# Patient Record
Sex: Male | Born: 1960
Health system: Southern US, Community
[De-identification: ages and names within clinical notes are randomized; demographics above are authoritative.]

## PROBLEM LIST (undated history)

## (undated) DIAGNOSIS — F1010 Alcohol abuse, uncomplicated: Secondary | ICD-10-CM

## (undated) DIAGNOSIS — M545 Low back pain, unspecified: Secondary | ICD-10-CM

## (undated) DIAGNOSIS — G8929 Other chronic pain: Secondary | ICD-10-CM

## (undated) DIAGNOSIS — M1990 Unspecified osteoarthritis, unspecified site: Secondary | ICD-10-CM

## (undated) DIAGNOSIS — F17200 Nicotine dependence, unspecified, uncomplicated: Secondary | ICD-10-CM

## (undated) HISTORY — DX: Nicotine dependence, unspecified, uncomplicated: F17.200

## (undated) HISTORY — DX: Low back pain, unspecified: M54.50

## (undated) HISTORY — DX: Low back pain: M54.5

## (undated) HISTORY — DX: Other chronic pain: G89.29

## (undated) HISTORY — DX: Alcohol abuse, uncomplicated: F10.10

---

## 2005-08-17 ENCOUNTER — Emergency Department (HOSPITAL_COMMUNITY): Admission: EM | Admit: 2005-08-17 | Discharge: 2005-08-17 | Payer: Self-pay | Admitting: Family Medicine

## 2005-08-20 ENCOUNTER — Emergency Department (HOSPITAL_COMMUNITY): Admission: EM | Admit: 2005-08-20 | Discharge: 2005-08-20 | Payer: Self-pay | Admitting: Family Medicine

## 2005-10-23 ENCOUNTER — Ambulatory Visit: Payer: Self-pay | Admitting: Internal Medicine

## 2005-10-24 ENCOUNTER — Ambulatory Visit: Payer: Self-pay | Admitting: Internal Medicine

## 2005-12-23 ENCOUNTER — Encounter: Admission: RE | Admit: 2005-12-23 | Discharge: 2006-01-21 | Payer: Self-pay | Admitting: Internal Medicine

## 2006-01-26 ENCOUNTER — Ambulatory Visit: Payer: Self-pay | Admitting: Internal Medicine

## 2008-01-04 ENCOUNTER — Encounter (INDEPENDENT_AMBULATORY_CARE_PROVIDER_SITE_OTHER): Payer: Self-pay | Admitting: Internal Medicine

## 2008-01-04 ENCOUNTER — Ambulatory Visit: Payer: Self-pay | Admitting: Hospitalist

## 2008-01-04 ENCOUNTER — Ambulatory Visit (HOSPITAL_COMMUNITY): Admission: RE | Admit: 2008-01-04 | Discharge: 2008-01-04 | Payer: Self-pay | Admitting: Hospitalist

## 2008-01-06 LAB — CONVERTED CEMR LAB
ALT: <8 units/L (ref 0–53)
AST: 12 units/L (ref 0–37)
Basophils Absolute: 0 10*3/uL (ref 0.0–0.1)
Basophils Relative: 0 % (ref 0–1)
Creatinine, Ser: 1.03 mg/dL (ref 0.40–1.50)
Eosinophils Relative: 1 % (ref 0–5)
HCT: 45.3 % (ref 39.0–52.0)
Hemoglobin: 15.3 g/dL (ref 13.0–17.0)
MCHC: 33.8 g/dL (ref 30.0–36.0)
Monocytes Absolute: 0.7 10*3/uL (ref 0.1–1.0)
Neutro Abs: 6 10*3/uL (ref 1.7–7.7)
Platelets: 240 10*3/uL (ref 150–400)
RDW: 13.5 % (ref 11.5–15.5)
Sodium: 144 meq/L (ref 135–145)
Total Bilirubin: 0.8 mg/dL (ref 0.3–1.2)
Total Protein: 7 g/dL (ref 6.0–8.3)

## 2008-01-07 ENCOUNTER — Encounter (INDEPENDENT_AMBULATORY_CARE_PROVIDER_SITE_OTHER): Payer: Self-pay | Admitting: Internal Medicine

## 2008-01-07 ENCOUNTER — Ambulatory Visit: Payer: Self-pay | Admitting: Internal Medicine

## 2008-01-07 ENCOUNTER — Encounter (INDEPENDENT_AMBULATORY_CARE_PROVIDER_SITE_OTHER): Payer: Self-pay | Admitting: Infectious Diseases

## 2008-01-08 ENCOUNTER — Encounter (INDEPENDENT_AMBULATORY_CARE_PROVIDER_SITE_OTHER): Payer: Self-pay | Admitting: Infectious Diseases

## 2008-07-11 ENCOUNTER — Ambulatory Visit: Payer: Self-pay | Admitting: Internal Medicine

## 2008-07-11 DIAGNOSIS — M549 Dorsalgia, unspecified: Secondary | ICD-10-CM | POA: Insufficient documentation

## 2009-10-10 ENCOUNTER — Ambulatory Visit (HOSPITAL_COMMUNITY): Admission: RE | Admit: 2009-10-10 | Discharge: 2009-10-10 | Payer: Self-pay | Admitting: Internal Medicine

## 2009-10-10 ENCOUNTER — Ambulatory Visit: Payer: Self-pay | Admitting: Internal Medicine

## 2009-10-10 DIAGNOSIS — M542 Cervicalgia: Secondary | ICD-10-CM

## 2009-10-10 DIAGNOSIS — D179 Benign lipomatous neoplasm, unspecified: Secondary | ICD-10-CM | POA: Insufficient documentation

## 2009-10-10 DIAGNOSIS — F17200 Nicotine dependence, unspecified, uncomplicated: Secondary | ICD-10-CM

## 2009-10-11 DIAGNOSIS — F1910 Other psychoactive substance abuse, uncomplicated: Secondary | ICD-10-CM | POA: Insufficient documentation

## 2009-10-11 LAB — CONVERTED CEMR LAB
ALT: 14 units/L (ref 0–53)
Amphetamine Screen, Ur: NEGATIVE
BUN: 12 mg/dL (ref 6–23)
Benzodiazepines.: NEGATIVE
CO2: 25 meq/L (ref 19–32)
Calcium: 9.6 mg/dL (ref 8.4–10.5)
Chloride: 105 meq/L (ref 96–112)
Cholesterol: 153 mg/dL (ref 0–200)
Creatinine, Ser: 1.02 mg/dL (ref 0.40–1.50)
Glucose, Bld: 97 mg/dL (ref 70–99)
HDL: 59 mg/dL (ref >39)
Marijuana Metabolite: POSITIVE — AB
Methadone: NEGATIVE
Opiates: NEGATIVE
Total CHOL/HDL Ratio: 2.6
Triglycerides: 54 mg/dL (ref <150)

## 2009-10-31 ENCOUNTER — Ambulatory Visit: Payer: Self-pay | Admitting: Internal Medicine

## 2010-10-08 NOTE — Assessment & Plan Note (Signed)
Summary: 2WK F/U/EST/VS   Vital Signs:  Patient profile:   50 year old male Height:      72 inches (182.88 cm) Weight:      145.04 pounds (65.93 kg) BMI:     19.74 Temp:     97 degrees F (36.11 degrees C) oral Pulse rate:   64 / minute BP sitting:   121 / 79  (right arm)  Vitals Entered By: Angelina Ok RN (October 31, 2009 8:39 AM) Is Patient Diabetic? No Pain Assessment Patient in pain? no      Nutritional Status BMI of 19 -24 = normal  Have you ever been in a relationship where you felt threatened, hurt or afraid?No   Does patient need assistance? Functional Status Self care Ambulation Normal Comments Shoulder and neck pain follow up.  Unable to afford medication.  Will refer to D. Hill and D. Tessitore.   Primary Care Provider:  Joaquin Courts  MD   History of Present Illness: Pt is a 50 yo male w/ past med hx below here for f/u of R neck/shoulder pain.  He has not gotton any of his prescriptions filled b/c he can't afford them, even thought the naproxen ins on $4 plan at [facility].  He does not that his pain is better and he periodically applies heat to the neck and shoulder and feels like this is helping.    Depression History:      The patient is having a depressed mood most of the day but denies diminished interest in his usual daily activities.        The patient denies that he feels like life is not worth living, denies that he wishes that he were dead, and denies that he has thought about ending his life.         Preventive Screening-Counseling & Management  Alcohol-Tobacco     Alcohol type: BEER / 2/ 40 A DAY     Smoking Status: current     Packs/Day: 3 cigs per day     Year Started: AT THE AGE OF 18  Current Medications (verified): 1)  Naprosyn 500 Mg Tabs (Naproxen) .... Take 1 Tablet By Mouth Two Times A Day As Needed For Pain. 2)  Flexeril 10 Mg Tabs (Cyclobenzaprine Hcl) .... Take One Tablet By Mouth Every 8 Hours As Needed For Pain.  Allergies  (verified): No Known Drug Allergies  Past History:  Past Medical History: Last updated: 07/11/2008 Chronic low back pain Etoh abuse Tobacco dependence Cocaine abuse  Social History: Last updated: 10/31/2009 + Smoking, + etoh-4 cans of beer a day. Drug use-denies cocaine use presently, however UDS positive  Risk Factors: Smoking Status: current (10/31/2009) Packs/Day: 3 cigs per day (10/31/2009)  Social History: Reviewed history from 10/10/2009 and no changes required. + Smoking, + etoh-4 cans of beer a day. Drug use-denies cocaine use presently, however UDS positive  Review of Systems       As per HPI.  Physical Exam  General:  alert, oriented, no distress. Eyes:  anicteric Lungs:  Normal respiratory effort, chest expands symmetrically. Lungs are clear to auscultation, no crackles or wheezes. Heart:  Normal rate and regular rhythm. S1 and S2 normal without gallop, murmur, click, rub or other extra sounds. Msk:  strength intact w/ neck flxn, extn, and lateral mvts.  FROM of bilateral shoulders.    Large orange size mass, likely lipoma on posterior R shoulder. Extremities:  no edema Neurologic:  gait normal. Psych:  flat affect.  Impression & Recommendations:  Problem # 1:  NECK PAIN (ICD-723.1) Xray shows some deg changes.  Pain improving.  Can pick up prescriptions if needed and advised ice/heat as needed.  Will f/u as needed.  Avoiding narcotics b/c of PSA hx.  His updated medication list for this problem includes:    Naprosyn 500 Mg Tabs (Naproxen) .Marland Kitchen... Take 1 tablet by mouth two times a day as needed for pain.    Flexeril 10 Mg Tabs (Cyclobenzaprine hcl) .Marland Kitchen... Take one tablet by mouth every 8 hours as needed for pain.  Problem # 2:  LIPOMA (ICD-214.9) Surgery referral placed today as pt would like it removed.  Orders: Surgical Referral (Surgery)  Problem # 3:  NICOTINE ADDICTION (ICD-305.1) smoking cessation counceling provided, including illegal  substances.  Precontemplative.  Problem # 4:  Preventive Health Care (ICD-V70.0) Tetanus shot today.  Up to date on lipids and HIV screening.   Problem # 5:  SUBSTANCE ABUSE, MULTIPLE (ICD-305.90) Councelled on alcohol and smoking cessation.   Complete Medication List: 1)  Naprosyn 500 Mg Tabs (Naproxen) .... Take 1 tablet by mouth two times a day as needed for pain. 2)  Flexeril 10 Mg Tabs (Cyclobenzaprine hcl) .... Take one tablet by mouth every 8 hours as needed for pain.  Patient Instructions: 1)  Please make a followup appointment as needed. 2)  Please try to quit smoking.  You can try calling 1-800-QUIT-NOW which is a free hotline to help you quit smoking.   Prevention & Chronic Care Immunizations   Influenza vaccine: Not documented   Influenza vaccine deferral: Refused  (10/31/2009)    Tetanus booster: Not documented    Pneumococcal vaccine: Not documented  Other Screening   PSA: Not documented   PSA action/deferral: Discussed-PSA declined  (10/31/2009)   Smoking status: current  (10/31/2009)  Lipids   Total Cholesterol: 153  (10/10/2009)   LDL: 83  (10/10/2009)   LDL Direct: Not documented   HDL: 59  (10/10/2009)   Triglycerides: 54  (10/10/2009)   Nursing Instructions: Give tetanus booster today      Vital Signs:  Patient profile:   50 year old male Height:      72 inches (182.88 cm) Weight:      145.04 pounds (65.93 kg) BMI:     19.74 Temp:     97 degrees F (36.11 degrees C) oral Pulse rate:   64 / minute BP sitting:   121 / 79  (right arm)  Vitals Entered By: Angelina Ok RN (October 31, 2009 8:39 AM)     Appended Document: 2WK F/U/EST/VS   Immunizations Administered:  Tetanus Vaccine:    Vaccine Type: Td    Site: left deltoid    Mfr: Sanofi Pasteur    Dose: 0.5 ml    Route: IM    Given by: Angelina Ok RN    Exp. Date: 07/24/2011    Lot #: [id number]    VIS given: 07/27/07 version given October 31, 2009.

## 2010-10-08 NOTE — Assessment & Plan Note (Signed)
Summary: R shoulder, lg soft nodule(10+cm), pain rad to side back/pcp-...   Vital Signs:  Patient profile:   50 year old male Height:      72 inches (182.88 cm) Weight:      143.02 pounds (65.01 kg) BMI:     19.47 Temp:     98.1 degrees F (36.72 degrees C) oral Pulse rate:   93 / minute BP sitting:   103 / 76  (right arm)  Vitals Entered By: Angelina Ok RN (October 10, 2009 9:22 AM) CC: Depression Is Patient Diabetic? No Pain Assessment Patient in pain? no      Nutritional Status BMI of 19 -24 = normal  Have you ever been in a relationship where you felt threatened, hurt or afraid?No   Does patient need assistance? Functional Status Self care Ambulation Normal Comments Problem with neck, shoulder.  Cannot lay on stomach or side ways.  Pain keeps him awake  Needs refill on muscle relaxant wooked a little.   Primary Care Provider:  Joaquin Courts  MD  CC:  Depression.  History of Present Illness: Pt is a 50 yo male w/ past med hx below here for evaluation of R side neck and shoulder pain that started months ago and has been getting progressively worse.  Pain is worse at night. Pain goes down his arm and axilla and around the R shoulder.  He did not have a known injury.  No fevers or other systemic complaints.  No parasthesias.  He has tried oxycodone and flexeril from a prior prescription that helps some with the pain.  He also has a knot on his R shoulder that is getting bigger and does not hurt, itches occasionally.  He notes feeling depressed b/c he is unemployed.  Has difficulty falling asleep b/c of pain in his R shoulder, appetite is good, no anhedonia.  Depression History:      The patient denies a depressed mood most of the day and a diminished interest in his usual daily activities.        The patient denies that he feels like life is not worth living, denies that he wishes that he were dead, and denies that he has thought about ending his life.        Comments:   Unemployment makes him depressed.   Preventive Screening-Counseling & Management  Alcohol-Tobacco     Alcohol type: BEER / 2/ 40 A DAY     Smoking Status: current     Packs/Day: 3 cigs per day     Year Started: AT THE AGE OF 18  Comments: Has decreased to 3 cigs per day.  Current Medications (verified): 1)  None  Allergies (verified): No Known Drug Allergies  Past History:  Past Medical History: Last updated: 07/11/2008 Chronic low back pain Etoh abuse Tobacco dependence Cocaine abuse  Social History: Last updated: 10/10/2009 + Smoking, + etoh-4 cans of beer a day. Drug use-denies cocaine use presently  Risk Factors: Smoking Status: current (10/10/2009) Packs/Day: 3 cigs per day (10/10/2009)  Family History: No family hx of MI or CVA.  Social History: Reviewed history from 07/11/2008 and no changes required. + Smoking, + etoh-4 cans of beer a day. Drug use-denies cocaine use presently Packs/Day:  3 cigs per day  Review of Systems       As per HPI.  Physical Exam  General:  alert, oriented, appears stated age, no distress. Eyes:  muddied sclera, PERRL. Neck:  Slightly TTP on R, decreased  ROM in all spheres.   Lungs:  normal respiratory effort and normal breath sounds.   Heart:  normal rate, regular rhythm, no murmur, no gallop, and no rub.   Abdomen:  Thin, +BS', soft, NT and ND. Extremities:  no edema Neurologic:  CN's intact, biceps, triceps, BR, and patellar reflexes symmetric bilaterally.  Strength equal in both UE w/ flxn, extn, pronation and suppination.  Skin:  Large orange size mass on R posterior shoulder.  Psych:  flat affect, fair eye contact, no SI.   Impression & Recommendations:  Problem # 1:  NECK PAIN (ICD-723.1)  ? 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  Will check plain film to evaluate for less likely etiology, such as lytic lesion.  Should see deg changes if that is contributing.  No evidence on exam for neural  encroachment so will hold on MRI for now.  Will tx w/ high dose NSAID's and flexeril to help w/ pain and f/u in 2-4 weeks.   Orders: Diagnostic X-Ray/Fluoroscopy (Diagnostic X-Ray/Flu) T-Drug Screen-Urine, (single) ([id number])  Problem # 2:  LIPOMA (ICD-214.9) Will refer to surgery for possible removal.   Problem # 3:  NICOTINE ADDICTION (ICD-305.1) Pre contemplative.   Problem # 4:  Preventive Health Care (ICD-V70.0)  Lipids today, HIV today.   Orders: T-Lipid Profile [id number]) T-Comprehensive Metabolic Panel [id number]) T-HIV Antibody  (Reflex) [phone])  Complete Medication List: 1)  Naprosyn 500 Mg Tabs (Naproxen) .... Take 1 tablet by mouth two times a day as needed for pain. 2)  Flexeril 10 Mg Tabs (Cyclobenzaprine hcl) .... Take one tablet by mouth every 8 hours as needed for pain.  Patient Instructions: 1)  Please make a followup appointment in 2-4 weeks for a checkup. 2)  You will be called with any abnormal labwork.  Please make sure your phone number is correct at the front desk. 3)  Please try to quit smoking.  You can try calling 1-800-QUIT-NOW which is a free hotline to help you quit smoking.  4)  Please see Jaynee Eagles today. Prescriptions: FLEXERIL 10 MG TABS (CYCLOBENZAPRINE HCL) Take one tablet by mouth every 8 hours as needed for pain.  #63 x 0   Entered and Authorized by:   Joaquin Courts  MD   Signed by:   Joaquin Courts  MD on 10/10/2009   Method used:   Print then Give to Patient   RxID:   [id number] NAPROSYN 500 MG TABS (NAPROXEN) Take 1 tablet by mouth two times a day as needed for pain.  #42 x 0   Entered and Authorized by:   Joaquin Courts  MD   Signed by:   Joaquin Courts  MD on 10/10/2009   Method used:   Print then Give to Patient   RxID:   [id number]   Prevention & Chronic Care Immunizations   Influenza vaccine: Not documented   Influenza vaccine deferral: Deferred  (10/10/2009)    Tetanus booster: Not  documented    Pneumococcal vaccine: Not documented  Other Screening   PSA: Not documented   Smoking status: current  (10/10/2009)  Lipids   Total Cholesterol: Not documented   LDL: Not documented   LDL Direct: Not documented   HDL: Not documented   Triglycerides: Not documented  Process Orders Check Orders Results:     [facility]: ABN not required for this insurance Tests Sent for requisitioning (October 10, 2009 9:57 AM):     10/10/2009: [facility] -- T-Lipid Profile [phone] (  signed)     10/10/2009: [facility] -- T-Comprehensive Metabolic Panel [[id number]] (signed)     10/10/2009: [facility] -- T-HIV Antibody  (Reflex) [[id number]] (signed)     10/10/2009: [facility] -- T-Drug Screen-Urine, (single) [[id number]] (signed)

## 2011-01-11 ENCOUNTER — Emergency Department (HOSPITAL_COMMUNITY): Payer: Self-pay

## 2011-01-11 ENCOUNTER — Emergency Department (HOSPITAL_COMMUNITY)
Admission: EM | Admit: 2011-01-11 | Discharge: 2011-01-11 | Disposition: A | Discharge status: Home / Self Care | Payer: Self-pay | Attending: Emergency Medicine | Admitting: Emergency Medicine

## 2011-01-11 DIAGNOSIS — M549 Dorsalgia, unspecified: Secondary | ICD-10-CM | POA: Insufficient documentation

## 2011-01-11 DIAGNOSIS — G8929 Other chronic pain: Secondary | ICD-10-CM | POA: Insufficient documentation

## 2011-01-11 DIAGNOSIS — M79609 Pain in unspecified limb: Secondary | ICD-10-CM | POA: Insufficient documentation

## 2011-01-11 DIAGNOSIS — M25569 Pain in unspecified knee: Secondary | ICD-10-CM | POA: Insufficient documentation

## 2011-01-11 LAB — POCT I-STAT, CHEM 8
Calcium, Ion: 1.13 mmol/L (ref 1.12–1.32)
Chloride: 102 mEq/L (ref 96–112)
Creatinine, Ser: 1.1 mg/dL (ref 0.4–1.5)
Glucose, Bld: 95 mg/dL (ref 70–99)
HCT: 51 % (ref 39.0–52.0)
Potassium: 4.4 mEq/L (ref 3.5–5.1)

## 2011-02-03 ENCOUNTER — Encounter: Payer: Self-pay | Admitting: Internal Medicine

## 2011-06-02 ENCOUNTER — Emergency Department (HOSPITAL_COMMUNITY)
Admission: EM | Admit: 2011-06-02 | Discharge: 2011-06-02 | Disposition: A | Discharge status: Home / Self Care | Payer: Self-pay | Attending: Emergency Medicine | Admitting: Emergency Medicine

## 2011-06-02 ENCOUNTER — Encounter (HOSPITAL_COMMUNITY): Payer: Self-pay | Admitting: Radiology

## 2011-06-02 ENCOUNTER — Emergency Department (HOSPITAL_COMMUNITY): Payer: Self-pay

## 2011-06-02 DIAGNOSIS — G8929 Other chronic pain: Secondary | ICD-10-CM | POA: Insufficient documentation

## 2011-06-02 DIAGNOSIS — M549 Dorsalgia, unspecified: Secondary | ICD-10-CM | POA: Insufficient documentation

## 2011-06-02 DIAGNOSIS — M129 Arthropathy, unspecified: Secondary | ICD-10-CM | POA: Insufficient documentation

## 2011-06-02 DIAGNOSIS — R51 Headache: Secondary | ICD-10-CM | POA: Insufficient documentation

## 2011-06-02 DIAGNOSIS — H81399 Other peripheral vertigo, unspecified ear: Secondary | ICD-10-CM | POA: Insufficient documentation

## 2011-06-24 ENCOUNTER — Encounter: Payer: Self-pay | Admitting: Internal Medicine

## 2011-06-24 ENCOUNTER — Ambulatory Visit (INDEPENDENT_AMBULATORY_CARE_PROVIDER_SITE_OTHER): Payer: Self-pay | Admitting: Internal Medicine

## 2011-06-24 DIAGNOSIS — R42 Dizziness and giddiness: Secondary | ICD-10-CM

## 2011-06-24 DIAGNOSIS — M549 Dorsalgia, unspecified: Secondary | ICD-10-CM

## 2011-06-24 MED ORDER — CYCLOBENZAPRINE HCL 10 MG PO TABS: 10.0000 mg | ORAL_TABLET | Freq: Three times a day (TID) | ORAL | Status: DC | PRN

## 2011-06-24 MED ORDER — NAPROXEN 500 MG PO TABS: 500.0000 mg | ORAL_TABLET | Freq: Two times a day (BID) | ORAL | Status: DC | PRN

## 2011-06-24 MED ORDER — MECLIZINE HCL 25 MG PO TABS: 25.0000 mg | ORAL_TABLET | Freq: Three times a day (TID) | ORAL | Status: DC | PRN

## 2011-06-24 NOTE — Assessment & Plan Note (Signed)
a 

## 2011-06-24 NOTE — Patient Instructions (Signed)
Please do not use recreational drugs. Please cut down on your drinking. Please do not drive whenever you feel dizzy.  Please come back if you get worse.

## 2011-06-25 NOTE — Progress Notes (Addendum)
HPI: Patient is 50 yo man with PMH of chronic back pain, neck pain, multiple substance abuse, who presents for a follow up visit for his recent ED visit due to dizziness and fall. Per patient, he had intermittent vertiginous dizziness and fall for which he visited ED in our hospital on 06-02-2011.  CT-head did not show any acute intracranial finding. He was diagnosed with vertigo and sent home with Meclizine. Today,  patient reports that he only has very minimal dizziness. He occasionally sees flashes in her eyes, but no feeling of spinning or ringing or loss of hearing. He never feels likely he is going to fall again since last ED visit.  She denies fever, chills, ear pain, headache, palpitation, abdominal pain. He reports that he uses cocaine. Last use of cocaine is 3 months ago.   ROS: General: no fevers, chills, changes in weight, changes in appetite Skin: no rash HEENT: no blurry vision, occasionally seeing flash lights in eyes. No hearing changes, sore throat Pulm: no dyspnea, coughing, wheezing CV: no chest pain, palpitations, shortness of breath Abd: no abdominal pain, nausea/vomiting, diarrhea/constipation GU: no dysuria, hematuria, polyuria Ext: no arthralgias, myalgias Neuro: no weakness, numbness, or tingling  Physical Exam:   General: not in acute stress. HEENT: PERRL, EOMI, no scleral icterus Cardiac: RRR, no rubs, murmurs or gallops Pulm: clear to auscultation bilaterally, moving normal volumes of air Abd: soft, nontender, nondistended, BS present Ext: warm and well perfused, no pedal edema Neuro: alert and oriented X3, cranial nerves II-XII grossly intact, strength and sensation to light touch equal in bilateral upper and lower extremities. Normal gait. Normal finger to nose test.  Assessment and Plan:  Problem 1: Dizziness: Most likely peripheral cause of vertigo. Her dizziniss is improving with Meclizine. No risk of fall during past more than 3 weeks. Neuron exam is  completely normal.  Another possibility is cocaine abuse. Patient refused to do UDS test. He reports cocaine use in the past. I did counseling about the importance of quitting cocaine. He understood that cocaine could potentially cause his dizziness and put him in high risk. He would like to quit his cocaine use. I let him continue Meclinze. Patient is instructed to not drive car whenever he feels dizzy.  Problem 2: back pain: his symptoms are mild. No focal neurologic signs. I refilled his Flexeril and Naproxen. Will follow up.            Patient was offered Colonoscopy referral. Because of financial reason, he would like to consider it in next visit. Therefore, it was deferred.

## 2011-06-30 NOTE — Progress Notes (Signed)
i agree with assessment and plan of Dr. Clyde Lundborg.

## 2011-12-24 ENCOUNTER — Encounter (HOSPITAL_COMMUNITY): Payer: Self-pay | Admitting: *Deleted

## 2011-12-24 ENCOUNTER — Emergency Department (HOSPITAL_COMMUNITY)
Admission: EM | Admit: 2011-12-24 | Discharge: 2011-12-24 | Disposition: A | Discharge status: Home / Self Care | Payer: Medicaid Other | Attending: Emergency Medicine | Admitting: Emergency Medicine

## 2011-12-24 DIAGNOSIS — M545 Low back pain, unspecified: Secondary | ICD-10-CM | POA: Insufficient documentation

## 2011-12-24 DIAGNOSIS — F17200 Nicotine dependence, unspecified, uncomplicated: Secondary | ICD-10-CM | POA: Insufficient documentation

## 2011-12-24 MED ORDER — IBUPROFEN 600 MG PO TABS: 600.0000 mg | ORAL_TABLET | Freq: Four times a day (QID) | ORAL | Status: AC | PRN

## 2011-12-24 MED ORDER — HYDROCODONE-ACETAMINOPHEN 5-500 MG PO TABS: 1.0000 | ORAL_TABLET | Freq: Four times a day (QID) | ORAL | Status: AC | PRN

## 2011-12-24 NOTE — ED Notes (Signed)
To ed for eval of lower back pain. This pain is intermittent but became worse last night with no reason. Pt states he experienced freq urination last night. Ambulatory without difficulty

## 2011-12-24 NOTE — ED Provider Notes (Signed)
History     CSN: [id number]  Arrival date & time 12/24/11  4132   First MD Initiated Contact with Patient 12/24/11 1049      Chief Complaint  Patient presents with  . Back Pain  pt c/o low back pain in past week. States hx same for several years intermittently. Says has been doing some bending/lifting recently which he feels made back pain worse, although no specific event/injury recalled. No radicular or leg pain. No numbness/weakness. No gi or gu c/o. No fever or chills.   (Consider location/radiation/quality/duration/timing/severity/associated sxs/prior treatment) Patient is a 51 y.o. male presenting with back pain. The history is provided by the patient.  Back Pain  Pertinent negatives include no fever, no numbness, no abdominal pain and no weakness.    Past Medical History  Diagnosis Date  . Chronic low back pain   . Tobacco dependence   . ETOH abuse     cociane abuse, tobacco abuse    History reviewed. No pertinent past surgical history.  History reviewed. No pertinent family history.  History  Substance Use Topics  . Smoking status: Current Everyday Smoker -- 6.0 packs/day    Types: Cigarettes  . Smokeless tobacco: Not on file  . Alcohol Use: 2.4 oz/week    4 Cans of beer per week     4 cans of beer a day.      Review of Systems  Constitutional: Negative for fever and chills.  HENT: Negative for neck pain.   Gastrointestinal: Negative for nausea, vomiting and abdominal pain.  Musculoskeletal: Positive for back pain.  Skin: Negative for rash and wound.  Neurological: Negative for weakness and numbness.    Allergies  Review of patient's allergies indicates no known allergies.  Home Medications   Current Outpatient Rx  Name Route Sig Dispense Refill  . MECLIZINE HCL 25 MG PO TABS Oral Take 1 tablet (25 mg total) by mouth 3 (three) times daily as needed for dizziness or nausea. 30 tablet 1    BP 112/70  Pulse 87  Temp 97.9 F (36.6 C)  Resp 18   SpO2 100%  Physical Exam  Nursing note and vitals reviewed. Constitutional: He is oriented to person, place, and time. He appears well-developed and well-nourished. No distress.  HENT:  Head: Atraumatic.  Eyes: Pupils are equal, round, and reactive to light.  Neck: Neck supple. No tracheal deviation present.  Cardiovascular: Normal rate.   Pulmonary/Chest: Effort normal. No accessory muscle usage. No respiratory distress.  Abdominal: Soft. He exhibits no distension. There is no tenderness.  Genitourinary:       No cva tenderness  Musculoskeletal: Normal range of motion. He exhibits no edema and no tenderness.       ctls spine non tender, aligned no step off. Lumbar muscular tenderness. No skin changes, erythema, sts or masses felt.   Neurological: He is alert and oriented to person, place, and time.       Motor intact bil. Steady gait. Straight leg raise neg. Normal reflexes.   Skin: Skin is warm and dry.  Psychiatric: He has a normal mood and affect.    ED Course  Procedures (including critical care time)      MDM  Pt c/o low back pain, hx same. No fall or specific injury recalled. Will rx pain.         Suzi Roots, MD 12/24/11 1105

## 2011-12-24 NOTE — Discharge Instructions (Signed)
Take motrin as need for pain.   You may  take vicodin as need for pain. No driving when taking vicodin. Also, do not take tylenol or acetaminophen containing medication when taking vicodin. Avoid bending at waist or heavy lifting more than 20 lbs for the next week. Try heat/heating pad to sore area. Follow up with primary care doctor in coming wee for recheck - discuss referral to back specialist and/or further workup if symptoms fail to improve/resolve. Return to ER if worse, leg numbness/weakness, intractable pain, other concern.      Back Pain, Adult Low back pain is very common. About 1 in 5 people have back pain.The cause of low back pain is rarely dangerous. The pain often gets better over time.About half of people with a sudden onset of back pain feel better in just 2 weeks. About 8 in 10 people feel better by 6 weeks.  CAUSES Some common causes of back pain include:  Strain of the muscles or ligaments supporting the spine.   Wear and tear (degeneration) of the spinal discs.   Arthritis.   Direct injury to the back.  DIAGNOSIS Most of the time, the direct cause of low back pain is not known.However, back pain can be treated effectively even when the exact cause of the pain is unknown.Answering your caregiver's questions about your overall health and symptoms is one of the most accurate ways to make sure the cause of your pain is not dangerous. If your caregiver needs more information, he or she may order lab work or imaging tests (X-rays or MRIs).However, even if imaging tests show changes in your back, this usually does not require surgery. HOME CARE INSTRUCTIONS For many people, back pain returns.Since low back pain is rarely dangerous, it is often a condition that people can learn to [facility] their own.   Remain active. It is stressful on the back to sit or stand in one place. Do not sit, drive, or stand in one place for more than 30 minutes at a time. Take short walks on  level surfaces as soon as pain allows.Try to increase the length of time you walk each day.   Do not stay in bed.Resting more than 1 or 2 days can delay your recovery.   Do not avoid exercise or work.Your body is made to move.It is not dangerous to be active, even though your back may hurt.Your back will likely heal faster if you return to being active before your pain is gone.   Pay attention to your body when you bend and lift. Many people have less discomfortwhen lifting if they bend their knees, keep the load close to their bodies,and avoid twisting. Often, the most comfortable positions are those that put less stress on your recovering back.   Find a comfortable position to sleep. Use a firm mattress and lie on your side with your knees slightly bent. If you lie on your back, put a pillow under your knees.   Only take over-the-counter or prescription medicines as directed by your caregiver. Over-the-counter medicines to reduce pain and inflammation are often the most helpful.Your caregiver may prescribe muscle relaxant drugs.These medicines help dull your pain so you can more quickly return to your normal activities and healthy exercise.   Put ice on the injured area.   Put ice in a plastic bag.   Place a towel between your skin and the bag.   Leave the ice on for 15 to 20 minutes, 3 to 4 times  a day for the first 2 to 3 days. After that, ice and heat may be alternated to reduce pain and spasms.   Ask your caregiver about trying back exercises and gentle massage. This may be of some benefit.   Avoid feeling anxious or stressed.Stress increases muscle tension and can worsen back pain.It is important to recognize when you are anxious or stressed and learn ways to manage it.Exercise is a great option.  SEEK MEDICAL CARE IF:  You have pain that is not relieved with rest or medicine.   You have pain that does not improve in 1 week.   You have new symptoms.   You are  generally not feeling well.  SEEK IMMEDIATE MEDICAL CARE IF:   You have pain that radiates from your back into your legs.   You develop new bowel or bladder control problems.   You have unusual weakness or numbness in your arms or legs.   You develop nausea or vomiting.   You develop abdominal pain.   You feel faint.  Document Released: 08/25/2005 Document Revised: 08/14/2011 Document Reviewed: 01/13/2011 [facility] Patient Information 2012 [place], [place].Back Pain, Adult Low back pain is very common. About 1 in 5 people have back pain.The cause of low back pain is rarely dangerous. The pain often gets better over time.About half of people with a sudden onset of back pain feel better in just 2 weeks. About 8 in 10 people feel better by 6 weeks.  CAUSES Some common causes of back pain include:  Strain of the muscles or ligaments supporting the spine.   Wear and tear (degeneration) of the spinal discs.   Arthritis.   Direct injury to the back.  DIAGNOSIS Most of the time, the direct cause of low back pain is not known.However, back pain can be treated effectively even when the exact cause of the pain is unknown.Answering your caregiver's questions about your overall health and symptoms is one of the most accurate ways to make sure the cause of your pain is not dangerous. If your caregiver needs more information, he or she may order lab work or imaging tests (X-rays or MRIs).However, even if imaging tests show changes in your back, this usually does not require surgery. HOME CARE INSTRUCTIONS For many people, back pain returns.Since low back pain is rarely dangerous, it is often a condition that people can learn to [facility] their own.   Remain active. It is stressful on the back to sit or stand in one place. Do not sit, drive, or stand in one place for more than 30 minutes at a time. Take short walks on level surfaces as soon as pain allows.Try to increase the length of time you  walk each day.   Do not stay in bed.Resting more than 1 or 2 days can delay your recovery.   Do not avoid exercise or work.Your body is made to move.It is not dangerous to be active, even though your back may hurt.Your back will likely heal faster if you return to being active before your pain is gone.   Pay attention to your body when you bend and lift. Many people have less discomfortwhen lifting if they bend their knees, keep the load close to their bodies,and avoid twisting. Often, the most comfortable positions are those that put less stress on your recovering back.   Find a comfortable position to sleep. Use a firm mattress and lie on your side with your knees slightly bent. If you lie on your  back, put a pillow under your knees.   Only take over-the-counter or prescription medicines as directed by your caregiver. Over-the-counter medicines to reduce pain and inflammation are often the most helpful.Your caregiver may prescribe muscle relaxant drugs.These medicines help dull your pain so you can more quickly return to your normal activities and healthy exercise.   Put ice on the injured area.   Put ice in a plastic bag.   Place a towel between your skin and the bag.   Leave the ice on for 15 to 20 minutes, 3 to 4 times a day for the first 2 to 3 days. After that, ice and heat may be alternated to reduce pain and spasms.   Ask your caregiver about trying back exercises and gentle massage. This may be of some benefit.   Avoid feeling anxious or stressed.Stress increases muscle tension and can worsen back pain.It is important to recognize when you are anxious or stressed and learn ways to manage it.Exercise is a great option.  SEEK MEDICAL CARE IF:  You have pain that is not relieved with rest or medicine.   You have pain that does not improve in 1 week.   You have new symptoms.   You are generally not feeling well.  SEEK IMMEDIATE MEDICAL CARE IF:   You have pain that  radiates from your back into your legs.   You develop new bowel or bladder control problems.   You have unusual weakness or numbness in your arms or legs.   You develop nausea or vomiting.   You develop abdominal pain.   You feel faint.  Document Released: 08/25/2005 Document Revised: 08/14/2011 Document Reviewed: 01/13/2011 [facility] Patient Information 2012 [place], [place].

## 2012-03-04 ENCOUNTER — Encounter (HOSPITAL_COMMUNITY): Payer: Self-pay | Admitting: Emergency Medicine

## 2012-03-04 ENCOUNTER — Emergency Department (HOSPITAL_COMMUNITY)
Admission: EM | Admit: 2012-03-04 | Discharge: 2012-03-04 | Disposition: A | Discharge status: Home / Self Care | Payer: Medicaid Other | Attending: Emergency Medicine | Admitting: Emergency Medicine

## 2012-03-04 ENCOUNTER — Emergency Department (HOSPITAL_COMMUNITY): Payer: Medicaid Other

## 2012-03-04 DIAGNOSIS — M25569 Pain in unspecified knee: Secondary | ICD-10-CM | POA: Insufficient documentation

## 2012-03-04 DIAGNOSIS — Z87891 Personal history of nicotine dependence: Secondary | ICD-10-CM | POA: Insufficient documentation

## 2012-03-04 MED ORDER — TRAMADOL HCL 50 MG PO TABS: 50.0000 mg | ORAL_TABLET | Freq: Four times a day (QID) | ORAL | Status: AC | PRN

## 2012-03-04 MED ORDER — HYDROCODONE-ACETAMINOPHEN 5-325 MG PO TABS
1.0000 | ORAL_TABLET | Freq: Once | ORAL | Status: AC
Start: 2012-03-04 — End: 2012-03-04
  Administered 2012-03-04: 1 via ORAL
  Filled 2012-03-04: qty 1

## 2012-03-04 NOTE — ED Provider Notes (Signed)
History   This chart was scribed for Lucas Lennert, MD by Sofie Rower. The patient was seen in room TR10C/TR10C and the patient's care was started at 4:38 PM     CSN: [id number]  Arrival date & time 03/04/12  1553   None     Chief Complaint  Patient presents with  . Knee Pain    (Consider location/radiation/quality/duration/timing/severity/associated sxs/prior treatment) Patient is a 51 y.o. male presenting with knee pain. The history is provided by the patient. No language interpreter was used.  Knee Pain This is a new problem. The current episode started 3 to 5 hours ago. The problem occurs constantly. The problem has not changed since onset.Pertinent negatives include no chest pain, no abdominal pain, no headaches and no shortness of breath. The symptoms are aggravated by bending. Nothing relieves the symptoms. He has tried nothing for the symptoms. The treatment provided no relief.   PCP is Dr. Clyde Lundborg.   Past Medical History  Diagnosis Date  . Chronic low back pain   . Tobacco dependence   . ETOH abuse     cociane abuse, tobacco abuse      History  Substance Use Topics  . Smoking status: Former Smoker    Types: Cigarettes    Quit date: 03/04/2009  . Smokeless tobacco: Not on file  . Alcohol Use: 2.4 oz/week    4 Cans of beer per week     3- 40oz per day      Review of Systems  Constitutional: Negative for fatigue.  HENT: Negative for congestion, sinus pressure and ear discharge.   Eyes: Negative for discharge.  Respiratory: Negative for cough and shortness of breath.   Cardiovascular: Negative for chest pain.  Gastrointestinal: Negative for abdominal pain and diarrhea.  Genitourinary: Negative for frequency and hematuria.  Musculoskeletal: Negative for back pain.  Skin: Negative for rash.  Neurological: Negative for seizures and headaches.  Hematological: Negative.   Psychiatric/Behavioral: Negative for hallucinations.    Allergies  Review of patient's  allergies indicates no known allergies.  Home Medications   Current Outpatient Rx  Name Route Sig Dispense Refill  . ACETAMINOPHEN 500 MG PO TABS Oral Take 1,500 mg by mouth every 6 (six) hours as needed. For pain      BP 132/76  Pulse 96  Temp 99.3 F (37.4 C) (Oral)  Resp 20  SpO2 97%  Physical Exam  Nursing note and vitals reviewed. Constitutional: He is oriented to person, place, and time. He appears well-developed.  HENT:  Head: Normocephalic.  Eyes: Conjunctivae are normal.  Neck: No tracheal deviation present.  Cardiovascular:  No murmur heard. Musculoskeletal: Normal range of motion. He exhibits tenderness (Tenderness to the lateral left knee, decreased ROM, pain with flexion. ).  Neurological: He is oriented to person, place, and time.  Skin: Skin is warm.  Psychiatric: He has a normal mood and affect.    ED Course  Procedures (including critical care time)  DIAGNOSTIC STUDIES: Oxygen Saturation is 97% on room air, normal by my interpretation.    COORDINATION OF CARE:  4:40PM- EDP at bedside discusses treatment plan concerning pain management and x-ray.    Labs Reviewed - No data to display Dg Knee Complete 4 Views Left  03/04/2012  *RADIOLOGY REPORT*  Clinical Data: Knee pain.  LEFT KNEE - COMPLETE 4+ VIEW  Comparison: None.  Findings: No joint effusion or fracture.  Trace patellofemoral osteophytosis.  Chondrocalcinosis in the medial and lateral compartments.  IMPRESSION: No acute  findings.  Minimal degenerative change.  Original Report Authenticated By: Reyes Ivan, M.D.      No diagnosis found.    MDM       The chart was scribed for me under my direct supervision.  I personally performed the history, physical, and medical decision making and all procedures in the evaluation of this patient.Lucas Lennert, MD 03/04/12 1728

## 2012-03-04 NOTE — Discharge Instructions (Signed)
Follow up with dr. Lestine Box if not improving.

## 2012-03-04 NOTE — ED Notes (Signed)
Pt c/o posterior knee pain; pt reports unable to bend knee- started at 11:30; reports that has had this pain before; unsure of injury; pt with no swelling to knee;  + pulses

## 2012-06-09 ENCOUNTER — Encounter (HOSPITAL_COMMUNITY): Payer: Self-pay | Admitting: *Deleted

## 2012-06-09 ENCOUNTER — Emergency Department (HOSPITAL_COMMUNITY)
Admission: EM | Admit: 2012-06-09 | Discharge: 2012-06-09 | Disposition: A | Discharge status: Home / Self Care | Payer: Medicaid Other | Attending: Emergency Medicine | Admitting: Emergency Medicine

## 2012-06-09 ENCOUNTER — Emergency Department (HOSPITAL_COMMUNITY): Payer: Medicaid Other

## 2012-06-09 DIAGNOSIS — M5030 Other cervical disc degeneration, unspecified cervical region: Secondary | ICD-10-CM | POA: Insufficient documentation

## 2012-06-09 DIAGNOSIS — Z87891 Personal history of nicotine dependence: Secondary | ICD-10-CM | POA: Insufficient documentation

## 2012-06-09 DIAGNOSIS — IMO0002 Reserved for concepts with insufficient information to code with codable children: Secondary | ICD-10-CM

## 2012-06-09 DIAGNOSIS — M542 Cervicalgia: Secondary | ICD-10-CM

## 2012-06-09 HISTORY — DX: Unspecified osteoarthritis, unspecified site: M19.90

## 2012-06-09 MED ORDER — TRAMADOL HCL 50 MG PO TABS: 50.0000 mg | ORAL_TABLET | Freq: Four times a day (QID) | ORAL | Status: DC | PRN

## 2012-06-09 MED ORDER — IBUPROFEN 600 MG PO TABS: 600.0000 mg | ORAL_TABLET | Freq: Four times a day (QID) | ORAL | Status: DC | PRN

## 2012-06-09 NOTE — ED Provider Notes (Signed)
History    This chart was scribed for Nelia Shi, MD, MD by Smitty Pluck. The patient was seen in room TR09C and the patient's care was started at 11:35AM.  CSN: [id number]  Arrival date & time 06/09/12  1108    Chief Complaint  Patient presents with  . Neck Pain     Patient is a 51 y.o. male presenting with neck pain. The history is provided by the patient. No language interpreter was used.  Neck Pain  Pertinent negatives include no weakness.   Lucas Walker is a 51 y.o. male with hx of arthritis who presents to the Emergency Department complaining of constant, moderate neck pain onset 2 days ago. Pt reports that he has ran out of medication that relieves his pain. Denies any other pain currently. Reports that lying down aggravates the pain.   Past Medical History  Diagnosis Date  . Chronic low back pain   . Tobacco dependence   . ETOH abuse     cociane abuse, tobacco abuse  . Arthritis     neck and left shoulder    History reviewed. No pertinent past surgical history.  No family history on file.  History  Substance Use Topics  . Smoking status: Former Smoker    Types: Cigarettes    Quit date: 03/04/2009  . Smokeless tobacco: Not on file  . Alcohol Use: 2.4 oz/week    4 Cans of beer per week     3- 40oz per day      Review of Systems  Constitutional: Negative for fever and chills.  HENT: Positive for neck pain.   Respiratory: Negative for shortness of breath.   Gastrointestinal: Negative for nausea and vomiting.  Neurological: Negative for weakness.  All other systems reviewed and are negative.    Allergies  Review of patient's allergies indicates no known allergies.  Home Medications   Current Outpatient Rx  Name Route Sig Dispense Refill  . ACETAMINOPHEN 500 MG PO TABS Oral Take 1,500 mg by mouth every 6 (six) hours as needed. For pain    . CYCLOBENZAPRINE HCL 10 MG PO TABS Oral Take 10 mg by mouth 3 (three) times daily as needed. For muscle  spasms    . IBUPROFEN 200 MG PO TABS Oral Take 200 mg by mouth every 6 (six) hours as needed. For pain    . TRAMADOL HCL 50 MG PO TABS Oral Take 50 mg by mouth every 6 (six) hours as needed. For pain    . IBUPROFEN 600 MG PO TABS Oral Take 1 tablet (600 mg total) by mouth every 6 (six) hours as needed for pain. 30 tablet 0  . TRAMADOL HCL 50 MG PO TABS Oral Take 1 tablet (50 mg total) by mouth every 6 (six) hours as needed for pain. 15 tablet 0    BP 120/76  Pulse 66  Temp 98 F (36.7 C) (Oral)  Resp 16  SpO2 99%  Physical Exam  Nursing note and vitals reviewed. Constitutional: He is oriented to person, place, and time. He appears well-developed. No distress.  HENT:  Head: Normocephalic and atraumatic.  Eyes: Pupils are equal, round, and reactive to light.  Neck: Muscular tenderness present. Decreased range of motion present.  Cardiovascular: Normal rate and intact distal pulses.   Pulmonary/Chest: No respiratory distress.  Abdominal: Normal appearance. He exhibits no distension.  Neurological: He is alert and oriented to person, place, and time. No cranial nerve deficit.  Skin: Skin is warm  and dry. No rash noted.  Psychiatric: He has a normal mood and affect. His behavior is normal.    ED Course  Procedures (including critical care time) DIAGNOSTIC STUDIES: Oxygen Saturation is 99% on room air, normal by my interpretation.    COORDINATION OF CARE: 11:37 AM Discussed ED treatment with pt     Labs Reviewed - No data to display Dg Cervical Spine 2-3 Views  06/09/2012  *RADIOLOGY REPORT*  Clinical Data: Neck pain.  CERVICAL SPINE - 2-3 VIEW  Comparison: None  Findings: Mild degenerative disc disease changes diffusely throughout the cervical spine, most pronounced at C5-6 and C6-7. No malalignment.  Prevertebral soft tissues are normal.  No fracture.  IMPRESSION: Degenerative disc disease.  No acute findings.   Original Report Authenticated By: Cyndie Chime, M.D.      1.  NECK PAIN   2. Degenerative disc disease       MDM         I personally performed the services described in this documentation, which was scribed in my presence. The recorded information has been reviewed and considered.    Nelia Shi, MD 06/09/12 [id number]

## 2012-06-09 NOTE — ED Notes (Signed)
Pt has returned from being out of the department 

## 2012-06-09 NOTE — ED Notes (Signed)
Pt is here with neck pain and under left posterior shoulder pain from arthritis and has ran out of pain medication

## 2012-06-09 NOTE — ED Notes (Signed)
Patient stated that he is out of his Tramadol for his neck pain.

## 2012-09-07 ENCOUNTER — Emergency Department (HOSPITAL_COMMUNITY)
Admission: EM | Admit: 2012-09-07 | Discharge: 2012-09-07 | Disposition: A | Discharge status: Home / Self Care | Payer: Medicaid Other | Attending: Emergency Medicine | Admitting: Emergency Medicine

## 2012-09-07 ENCOUNTER — Encounter (HOSPITAL_COMMUNITY): Payer: Self-pay | Admitting: *Deleted

## 2012-09-07 ENCOUNTER — Emergency Department (HOSPITAL_COMMUNITY): Payer: Medicaid Other

## 2012-09-07 DIAGNOSIS — G8929 Other chronic pain: Secondary | ICD-10-CM | POA: Insufficient documentation

## 2012-09-07 DIAGNOSIS — M545 Low back pain, unspecified: Secondary | ICD-10-CM | POA: Insufficient documentation

## 2012-09-07 DIAGNOSIS — M25559 Pain in unspecified hip: Secondary | ICD-10-CM | POA: Insufficient documentation

## 2012-09-07 DIAGNOSIS — F1010 Alcohol abuse, uncomplicated: Secondary | ICD-10-CM | POA: Insufficient documentation

## 2012-09-07 DIAGNOSIS — F1410 Cocaine abuse, uncomplicated: Secondary | ICD-10-CM | POA: Insufficient documentation

## 2012-09-07 DIAGNOSIS — M549 Dorsalgia, unspecified: Secondary | ICD-10-CM

## 2012-09-07 DIAGNOSIS — Z87891 Personal history of nicotine dependence: Secondary | ICD-10-CM | POA: Insufficient documentation

## 2012-09-07 MED ORDER — IBUPROFEN 600 MG PO TABS: 600.0000 mg | ORAL_TABLET | Freq: Three times a day (TID) | ORAL | Status: DC | PRN

## 2012-09-07 MED ORDER — HYDROCODONE-ACETAMINOPHEN 5-325 MG PO TABS: 2.0000 | ORAL_TABLET | Freq: Four times a day (QID) | ORAL | Status: DC | PRN

## 2012-09-07 MED ORDER — HYDROCODONE-ACETAMINOPHEN 5-325 MG PO TABS
2.0000 | ORAL_TABLET | Freq: Once | ORAL | Status: AC
  Administered 2012-09-07: 2 via ORAL
  Filled 2012-09-07: qty 2

## 2012-09-07 NOTE — ED Notes (Signed)
Patient transported to X-ray 

## 2012-09-07 NOTE — ED Notes (Signed)
Pt is here with lower back pain and hip pain that is chronic.  No bowel or bladder incontinence.  Pt needs pain control.  Pt has ran out of medications

## 2012-09-07 NOTE — ED Provider Notes (Signed)
History   This chart was scribed for Suzi Roots, MD by Charolett Bumpers, ED Scribe. The patient was seen in room TR07C/TR07C. Patient's care was started at 1241.   CSN: [id number]  Arrival date & time 09/07/12  1224   First MD Initiated Contact with Patient 09/07/12 1241      Chief Complaint  Patient presents with  . Back Pain  . Hip Pain    The history is provided by the patient. No language interpreter was used.  Lucas Walker is a 51 y.o. male who presents to the Emergency Department complaining of 3 days of lower back pain and hip pain. He states that he has chronic issues with his back. He has taken Flexeril, Tramadol, and Motrin. He states that he fell last night, but denies any injuries or falls proceeding the pain. He states that he is unemployed. He denies any numbness or weakness. He denies any redness or skin changes. He states he had x-rays preformed the last time he was seen here. No numbness/weakness. No gi or gu c/o. No fever or chills.    Past Medical History  Diagnosis Date  . Chronic low back pain   . Tobacco dependence   . ETOH abuse     cociane abuse, tobacco abuse  . Arthritis     neck and left shoulder    History reviewed. No pertinent past surgical history.  No family history on file.  History  Substance Use Topics  . Smoking status: Former Smoker    Types: Cigarettes    Quit date: 03/04/2009  . Smokeless tobacco: Not on file  . Alcohol Use: 2.4 oz/week    4 Cans of beer per week     Comment: 3- 40oz per day      Review of Systems  Constitutional: Negative for fever.  HENT: Negative for neck pain.   Gastrointestinal: Negative for abdominal pain.  Musculoskeletal: Positive for back pain and arthralgias.  Neurological: Negative for weakness and numbness.  All other systems reviewed and are negative.    Allergies  Review of patient's allergies indicates no known allergies.  Home Medications   Current Outpatient Rx  Name   Route  Sig  Dispense  Refill  . ACETAMINOPHEN 500 MG PO TABS   Oral   Take 1,500 mg by mouth every 6 (six) hours as needed. For pain         . CYCLOBENZAPRINE HCL 10 MG PO TABS   Oral   Take 10 mg by mouth 3 (three) times daily as needed. For muscle spasms         . IBUPROFEN 200 MG PO TABS   Oral   Take 200 mg by mouth every 6 (six) hours as needed. For pain         . IBUPROFEN 600 MG PO TABS   Oral   Take 1 tablet (600 mg total) by mouth every 6 (six) hours as needed for pain.   30 tablet   0   . TRAMADOL HCL 50 MG PO TABS   Oral   Take 50 mg by mouth every 6 (six) hours as needed. For pain         . TRAMADOL HCL 50 MG PO TABS   Oral   Take 1 tablet (50 mg total) by mouth every 6 (six) hours as needed for pain.   15 tablet   0     BP 133/86  Pulse 83  Temp 97.4 F (  36.3 C) (Oral)  Resp 18  SpO2 100%  Physical Exam  Nursing note and vitals reviewed. Constitutional: He is oriented to person, place, and time. He appears well-developed and well-nourished. No distress.  HENT:  Head: Normocephalic and atraumatic.  Eyes: Conjunctivae normal and EOM are normal. No scleral icterus.  Neck: Neck supple. No tracheal deviation present.  Cardiovascular: Normal rate.   Pulmonary/Chest: Effort normal. No respiratory distress.  Abdominal: Soft. There is no tenderness.  Genitourinary:       No cva  tenderness  Musculoskeletal: Normal range of motion. He exhibits tenderness.       Left lumbar tenderness, otherwise CTLS spine, non tender, aligned, no step off.    Neurological: He is alert and oriented to person, place, and time. No cranial nerve deficit.       Motor intact bil. Steady gait. Straight leg raise neg.   Skin: Skin is warm and dry. No rash noted.  Psychiatric: He has a normal mood and affect. His behavior is normal.    ED Course  Procedures (including critical care time)  DIAGNOSTIC STUDIES: Oxygen Saturation is 100% on room air, normal by my  interpretation.    COORDINATION OF CARE:  13:13-Discussed planned course of treatment with the patient including pain medication and an x-ray of the lumbar spine, who is agreeable at this time.   13:15-Medication Orders: Hydrocodone-acetaminophen (Norco/Vicodin) 5-325 mg per tablet 2 tablet-once.   Dg Lumbar Spine Complete  09/07/2012  *RADIOLOGY REPORT*  Clinical Data: Larey Seat.  Back pain.  LUMBAR SPINE - COMPLETE 4+ VIEW  Comparison: None  Findings: The lateral film demonstrates normal alignment. Vertebral bodies and disc spaces are maintained.  No acute bony findings.  Normal alignment of the facet joints and no pars defects.  The visualized bony pelvis in intact.  IMPRESSION: Normal alignment and no acute bony findings.   Original Report Authenticated By: Rudie Meyer, M.D.        MDM  I personally performed the services described in this documentation, which was scribed in my presence. The recorded information has been reviewed and is accurate.  Pt says has ride, does not have to drive. vicodin po.   Xrays.  Reviewed nursing notes and prior charts for additional history.       Suzi Roots, MD 09/07/12 [phone]

## 2012-09-07 NOTE — ED Notes (Signed)
Pt states back pain x 3 days with on and off hip pain; pt currently denies hip pain; pt states difficulty walking and dressing in morning due to pain in back; pt mentating appropriately. Pt states he has run out of medications.

## 2012-12-29 ENCOUNTER — Encounter (HOSPITAL_COMMUNITY): Payer: Self-pay | Admitting: Emergency Medicine

## 2012-12-29 ENCOUNTER — Emergency Department (HOSPITAL_COMMUNITY)
Admission: EM | Admit: 2012-12-29 | Discharge: 2012-12-29 | Disposition: A | Discharge status: Home / Self Care | Payer: Medicaid Other | Attending: Emergency Medicine | Admitting: Emergency Medicine

## 2012-12-29 DIAGNOSIS — Z87891 Personal history of nicotine dependence: Secondary | ICD-10-CM | POA: Insufficient documentation

## 2012-12-29 DIAGNOSIS — M545 Low back pain, unspecified: Secondary | ICD-10-CM | POA: Insufficient documentation

## 2012-12-29 DIAGNOSIS — M129 Arthropathy, unspecified: Secondary | ICD-10-CM | POA: Insufficient documentation

## 2012-12-29 DIAGNOSIS — G8929 Other chronic pain: Secondary | ICD-10-CM | POA: Insufficient documentation

## 2012-12-29 MED ORDER — METHOCARBAMOL 500 MG PO TABS: 500.0000 mg | ORAL_TABLET | Freq: Three times a day (TID) | ORAL | Status: DC

## 2012-12-29 MED ORDER — MELOXICAM 7.5 MG PO TABS: 15.0000 mg | ORAL_TABLET | Freq: Every day | ORAL | Status: DC

## 2012-12-29 MED ORDER — TRAMADOL HCL 50 MG PO TABS: 100.0000 mg | ORAL_TABLET | Freq: Three times a day (TID) | ORAL | Status: DC | PRN

## 2012-12-29 MED ORDER — OXYCODONE-ACETAMINOPHEN 5-325 MG PO TABS
2.0000 | ORAL_TABLET | Freq: Once | ORAL | Status: AC
  Administered 2012-12-29: 2 via ORAL
  Filled 2012-12-29: qty 2

## 2012-12-29 NOTE — ED Notes (Signed)
Pt c/o left lower back pain with radiation down left hip

## 2012-12-29 NOTE — ED Provider Notes (Signed)
Medical screening examination/treatment/procedure(s) were performed by non-physician practitioner and as supervising physician I was immediately available for consultation/collaboration.   Detravion Tester, MD 12/29/12 2316 

## 2012-12-29 NOTE — ED Provider Notes (Signed)
History    This chart was scribed for non-physician practitioner Glade Nurse, PA-C working with Loren Racer, MD by Gerlean Ren, ED Scribe. This patient was seen in room TR08C/TR08C and the patient's care was started at 4:16 PM.     CSN: [id number]  Arrival date & time 12/29/12  1522   First MD Initiated Contact with Patient 12/29/12 1609      Chief Complaint  Patient presents with  . Back Pain     The history is provided by the patient. No language interpreter was used.  Lucas Walker is a 52 y.o. male who presents to the Emergency Department complaining of a flare up in chronic left lower back pain that radiates down left hip.  Pain has not changed in type or location.  No recent falls, injuries, or traumas.  Pt ambulatory to room.  Pt states flare ups occur once ever 6-8 months.  Pt denies nausea, emesis, fevers, chills, numbness, tingling, incontinence of bowel or bladder.  Last normal bowel movement 3 days ago.  No known allergies.     Past Medical History  Diagnosis Date  . Chronic low back pain   . Tobacco dependence   . ETOH abuse     cociane abuse, tobacco abuse  . Arthritis     neck and left shoulder    History reviewed. No pertinent past surgical history.  History reviewed. No pertinent family history.  History  Substance Use Topics  . Smoking status: Former Smoker    Types: Cigarettes    Quit date: 03/04/2009  . Smokeless tobacco: Not on file  . Alcohol Use: 2.4 oz/week    4 Cans of beer per week     Comment: 3- 40oz per day      Review of Systems  Constitutional: Negative for fever and diaphoresis.  HENT: Negative for neck pain and neck stiffness.   Eyes: Negative for visual disturbance.  Respiratory: Negative for apnea, chest tightness and shortness of breath.   Cardiovascular: Negative for chest pain and palpitations.  Gastrointestinal: Negative for nausea, vomiting, diarrhea and constipation.  Genitourinary: Negative for dysuria.   Musculoskeletal: Positive for back pain (chronic). Negative for gait problem.  Skin: Negative for rash.  Neurological: Negative for dizziness, weakness, light-headedness, numbness and headaches.    Allergies  Review of patient's allergies indicates no known allergies.  Home Medications   Current Outpatient Rx  Name  Route  Sig  Dispense  Refill  . HYDROcodone-acetaminophen (NORCO/VICODIN) 5-325 MG per tablet   Oral   Take 2 tablets by mouth every 6 (six) hours as needed for pain.   20 tablet   0   . ibuprofen (ADVIL,MOTRIN) 200 MG tablet   Oral   Take 600 mg by mouth every 6 (six) hours as needed. For pain         . ibuprofen (ADVIL,MOTRIN) 600 MG tablet   Oral   Take 1 tablet (600 mg total) by mouth every 8 (eight) hours as needed for pain. Take with food.   20 tablet   0     BP 104/71  Pulse 90  Temp(Src) 98.3 F (36.8 C) (Oral)  Resp 18  SpO2 99%  Physical Exam  Nursing note and vitals reviewed. Constitutional: He is oriented to person, place, and time. He appears well-developed and well-nourished. No distress.  HENT:  Head: Normocephalic and atraumatic.  Eyes: Conjunctivae and EOM are normal.  Neck: Normal range of motion. Neck supple.  No meningeal signs  Cardiovascular: Normal rate, regular rhythm and normal heart sounds.  Exam reveals no gallop and no friction rub.   No murmur heard. Pulmonary/Chest: Effort normal and breath sounds normal. No respiratory distress. He has no wheezes. He has no rales. He exhibits no tenderness.  Abdominal: Soft. Bowel sounds are normal. He exhibits no distension. There is no tenderness. There is no rebound and no guarding.  Musculoskeletal: Normal range of motion. He exhibits no edema and no tenderness.  No step-offs noted on C-spine Full range of motion of the T-spine and L-spine No tenderness to palpation of the spinous processes of the C-spine, T-spine or L-spine Mild tenderness to palpation of the paraspinous muscles  of the L-spine bilaterally Normal strength in upper and lower extremities bilaterally including dorsiflexion and plantar flexion, strong and equal grip strength  Neurological: He is alert and oriented to person, place, and time. No cranial nerve deficit.  Speech is clear and goal oriented, follows commands Sensation normal to light touch and two point discrimination Moves extremities without ataxia, coordination intact, limited straight leg raise secondary to pain bilaterally Normal gait and balance  Skin: Skin is warm and dry. He is not diaphoretic. No erythema.  Psychiatric: He has a normal mood and affect.    ED Course  Procedures (including critical care time) DIAGNOSTIC STUDIES: Oxygen Saturation is 99% on room air, normal by my interpretation.    COORDINATION OF CARE: 4:23 PM- Informed pt that his chronic pain needs to be managed outside of the ED and provided pt with necessary contact information to establish necessary relationships.  Pt verbalizes understanding and agrees with plan.     1. Chronic low back pain    Medications  oxyCODONE-acetaminophen (PERCOCET/ROXICET) 5-325 MG per tablet 2 tablet (2 tablets Oral Given 12/29/12 1631)    Discharge Medication List as of 12/29/2012  4:58 PM    START taking these medications   Details  meloxicam (MOBIC) 7.5 MG tablet Take 2 tablets (15 mg total) by mouth daily., Starting 12/29/2012, Until Discontinued, Print    methocarbamol (ROBAXIN) 500 MG tablet Take 1 tablet (500 mg total) by mouth 3 (three) times daily., Starting 12/29/2012, Until Discontinued, Print    traMADol (ULTRAM) 50 MG tablet Take 2 tablets (100 mg total) by mouth every 8 (eight) hours as needed for pain., Starting 12/29/2012, Until Discontinued, Print           MDM  52 y.o. Male presents with acute exacerbation of his chronic back pain. No new injury. No neurological deficits and normal neuro exam.  Patient can walk but states is painful.  No loss of bowel or  bladder control.  No concern for cauda equina.  No fever, night sweats, weight loss, h/o cancer, IVDU.    Discussed with pt earlier visits show no fractures, subluxation, or other serious osseous lesions, and that today's PE does not indicate any acute changes. Informed pt that his chronic pain needs to be managed outside of the ED and provided pt with necessary contact information to establish necessary relationships.  I personally performed the services described in this documentation, which was scribed in my presence. The recorded information has been reviewed and is accurate.    Glade Nurse, PA-C 12/29/12 2135

## 2012-12-29 NOTE — ED Notes (Signed)
Pt c/o lower back pain on and off x 3 years. Last episode was 8 months ago. Pain started again last pm. Denies radiation to legs or numbness/tingling.

## 2013-04-12 ENCOUNTER — Encounter (HOSPITAL_COMMUNITY): Payer: Self-pay | Admitting: *Deleted

## 2013-04-12 ENCOUNTER — Emergency Department (HOSPITAL_COMMUNITY)
Admission: EM | Admit: 2013-04-12 | Discharge: 2013-04-12 | Disposition: A | Discharge status: Home / Self Care | Payer: Medicaid Other | Attending: Emergency Medicine | Admitting: Emergency Medicine

## 2013-04-12 DIAGNOSIS — R072 Precordial pain: Secondary | ICD-10-CM | POA: Insufficient documentation

## 2013-04-12 DIAGNOSIS — T6391XA Toxic effect of contact with unspecified venomous animal, accidental (unintentional), initial encounter: Secondary | ICD-10-CM | POA: Insufficient documentation

## 2013-04-12 DIAGNOSIS — R079 Chest pain, unspecified: Secondary | ICD-10-CM

## 2013-04-12 DIAGNOSIS — T63461A Toxic effect of venom of wasps, accidental (unintentional), initial encounter: Secondary | ICD-10-CM | POA: Insufficient documentation

## 2013-04-12 DIAGNOSIS — R1310 Dysphagia, unspecified: Secondary | ICD-10-CM | POA: Insufficient documentation

## 2013-04-12 DIAGNOSIS — Z87891 Personal history of nicotine dependence: Secondary | ICD-10-CM | POA: Insufficient documentation

## 2013-04-12 DIAGNOSIS — Z79899 Other long term (current) drug therapy: Secondary | ICD-10-CM | POA: Insufficient documentation

## 2013-04-12 DIAGNOSIS — Y9389 Activity, other specified: Secondary | ICD-10-CM | POA: Insufficient documentation

## 2013-04-12 DIAGNOSIS — Y929 Unspecified place or not applicable: Secondary | ICD-10-CM | POA: Insufficient documentation

## 2013-04-12 DIAGNOSIS — G8929 Other chronic pain: Secondary | ICD-10-CM | POA: Insufficient documentation

## 2013-04-12 DIAGNOSIS — Z8739 Personal history of other diseases of the musculoskeletal system and connective tissue: Secondary | ICD-10-CM | POA: Insufficient documentation

## 2013-04-12 LAB — POCT I-STAT, CHEM 8
Creatinine, Ser: 1.1 mg/dL (ref 0.50–1.35)
HCT: 49 % (ref 39.0–52.0)
Hemoglobin: 16.7 g/dL (ref 13.0–17.0)
Sodium: 140 mEq/L (ref 135–145)
TCO2: 25 mmol/L (ref 0–100)

## 2013-04-12 LAB — POCT I-STAT TROPONIN I

## 2013-04-12 NOTE — ED Notes (Signed)
PT is here with multiple bee stings to back of neck , right chest, and left posterior upper arm.  Pt states now with a pain that is just sitting there to his mid sternum and thinks it is chest pain.  No sob or throat swelling

## 2013-04-12 NOTE — ED Provider Notes (Signed)
CSN: [id number]     Arrival date & time 04/12/13  1652 History    This chart was scribed for Felicie Morn, NP working with Shelda Jakes, MD by Quintella Reichert, ED Scribe. This patient was seen in room TR10C/TR10C and the patient's care was started at 8:24 PM.     Chief Complaint  Patient presents with  . Bee sting     Patient is a 52 y.o. male presenting with animal bite. The history is provided by the patient. No language interpreter was used.  Animal Bite Contact animal:  Insect Location:  Head/neck, torso and shoulder/arm Head/neck injury location:  Neck Shoulder/arm injury location:  L arm Torso injury location:  R chest Pain details:    Quality:  Sore   Severity:  Moderate   Timing:  Constant   Progression:  Unchanged Associated symptoms: no fever, no numbness, no rash and no swelling   Associated symptoms comment:  Difficulty swallowing, chest pain.   HPI Comments: Lucas Walker is a 52 y.o. male who presents to the Emergency Department complaining of multiple bee stings that he sustained several hours ago.  Pt reports that he was stung by 4-5 bees to the neck, chest and arm and presently he complains of moderate soreness to these areas.Marland Kitchen  He denies h/o bee allergies.  However he states that earlier he experienced difficulty swallowing and a mid-sternal chest pain.  Both of these symptoms have since resolved.  Now he states he can still feel a "knot" in his chest.  He denies SOB, facial swelling, nausea, emesis, or any other associated symptoms.  Pt has no PCP      Past Medical History  Diagnosis Date  . Chronic low back pain   . Tobacco dependence   . ETOH abuse     cociane abuse, tobacco abuse  . Arthritis     neck and left shoulder    History reviewed. No pertinent past surgical history.   No family history on file.   History  Substance Use Topics  . Smoking status: Former Smoker    Types: Cigarettes    Quit date: 03/04/2009  . Smokeless tobacco:  Not on file  . Alcohol Use: 2.4 oz/week    4 Cans of beer per week     Comment: 3- 40oz per day     Review of Systems  Constitutional: Negative for fever.  HENT: Positive for trouble swallowing.   Cardiovascular: Positive for chest pain.  Skin: Negative for rash.  Neurological: Negative for numbness.  All other systems reviewed and are negative.      Allergies  Review of patient's allergies indicates no known allergies.  Home Medications   Current Outpatient Rx  Name  Route  Sig  Dispense  Refill  . ibuprofen (ADVIL,MOTRIN) 200 MG tablet   Oral   Take 400 mg by mouth daily as needed (for back pain).          BP 108/75  Pulse 90  Temp(Src) 98.3 F (36.8 C) (Oral)  Resp 16  SpO2 97%  Physical Exam  Nursing note and vitals reviewed. Constitutional: He is oriented to person, place, and time. He appears well-developed and well-nourished. No distress.  HENT:  Head: Normocephalic and atraumatic.  Eyes: EOM are normal.  Neck: Neck supple.  Cardiovascular: Normal rate, regular rhythm and normal heart sounds.   Pulmonary/Chest: Effort normal and breath sounds normal. No respiratory distress. He has no wheezes. He has no rales.  Musculoskeletal: Normal  range of motion.  Neurological: He is alert and oriented to person, place, and time.  Skin: Skin is warm and dry.  Psychiatric: He has a normal mood and affect. His behavior is normal.    ED Course  Procedures (including critical care time)  DIAGNOSTIC STUDIES: Oxygen Saturation is 97% on room air, normal by my interpretation.    Date: 04/12/2013  Rate: 92  Rhythm: normal sinus rhythm  QRS Axis: indeterminate  Intervals: normal  ST/T Wave abnormalities: nonspecific ST changes  Conduction Disutrbances:right bundle branch block  Narrative Interpretation:   Old EKG Reviewed: none available  COORDINATION OF CARE: 8:29 PM-Discussed treatment plan which includes labs and EKG with pt at bedside and pt agreed to  plan.    Labs Reviewed  POCT I-STAT, CHEM 8 - Abnormal; Notable for the following:    Glucose, Bld 106 (*)    All other components within normal limits  POCT I-STAT TROPONIN I  POCT I-STAT TROPONIN I   No results found.  No diagnosis found.  MDM  Patient presented to ED for evaluation after suffering multiple stings from ground bees.  Denies shortness of breath, swelling of tongue.  No hives.  Discomfort to lateral right neck and right anterior chest wall.  While awaiting evaluation, patient states he developed "chest pain", localized to epigastric area, now resolved.  Negative troponin x 2.  ECG without signs of ischemia.    I personally performed the services described in this documentation, which was scribed in my presence. The recorded information has been reviewed and is accurate.    Jimmye Norman, NP 04/13/13 0002

## 2013-04-12 NOTE — ED Notes (Signed)
Pt stung in multiple places by a swarm of bees. Denies SOB, CP, or difficulty breathing at this time.

## 2013-04-16 NOTE — ED Provider Notes (Signed)
Medical screening examination/treatment/procedure(s) were performed by non-physician practitioner and as supervising physician I was immediately available for consultation/collaboration.   Shelda Jakes, MD 04/16/13 [phone]

## 2013-06-06 ENCOUNTER — Encounter (HOSPITAL_COMMUNITY): Payer: Self-pay

## 2013-06-06 ENCOUNTER — Emergency Department (HOSPITAL_COMMUNITY): Payer: Medicaid Other

## 2013-06-06 ENCOUNTER — Emergency Department (HOSPITAL_COMMUNITY)
Admission: EM | Admit: 2013-06-06 | Discharge: 2013-06-06 | Disposition: A | Discharge status: Home / Self Care | Payer: Medicaid Other | Attending: Emergency Medicine | Admitting: Emergency Medicine

## 2013-06-06 DIAGNOSIS — M542 Cervicalgia: Secondary | ICD-10-CM | POA: Insufficient documentation

## 2013-06-06 DIAGNOSIS — F17200 Nicotine dependence, unspecified, uncomplicated: Secondary | ICD-10-CM | POA: Insufficient documentation

## 2013-06-06 DIAGNOSIS — M436 Torticollis: Secondary | ICD-10-CM | POA: Insufficient documentation

## 2013-06-06 DIAGNOSIS — Z87891 Personal history of nicotine dependence: Secondary | ICD-10-CM | POA: Insufficient documentation

## 2013-06-06 DIAGNOSIS — G8929 Other chronic pain: Secondary | ICD-10-CM | POA: Insufficient documentation

## 2013-06-06 DIAGNOSIS — M19019 Primary osteoarthritis, unspecified shoulder: Secondary | ICD-10-CM | POA: Insufficient documentation

## 2013-06-06 DIAGNOSIS — M25559 Pain in unspecified hip: Secondary | ICD-10-CM | POA: Insufficient documentation

## 2013-06-06 DIAGNOSIS — IMO0002 Reserved for concepts with insufficient information to code with codable children: Secondary | ICD-10-CM | POA: Insufficient documentation

## 2013-06-06 LAB — POCT I-STAT, CHEM 8
BUN: 10 mg/dL (ref 6–23)
Calcium, Ion: 1.19 mmol/L (ref 1.12–1.23)
Creatinine, Ser: 1.1 mg/dL (ref 0.50–1.35)
TCO2: 25 mmol/L (ref 0–100)

## 2013-06-06 LAB — CBC
Hemoglobin: 15.5 g/dL (ref 13.0–17.0)
MCH: 32.6 pg (ref 26.0–34.0)
MCV: 94.5 fL (ref 78.0–100.0)
RBC: 4.75 MIL/uL (ref 4.22–5.81)

## 2013-06-06 MED ORDER — TRAMADOL HCL 50 MG PO TABS: 50.0000 mg | ORAL_TABLET | Freq: Four times a day (QID) | ORAL | Status: DC | PRN

## 2013-06-06 MED ORDER — IBUPROFEN 600 MG PO TABS: 600.0000 mg | ORAL_TABLET | Freq: Four times a day (QID) | ORAL | Status: DC | PRN

## 2013-06-06 MED ORDER — KETOROLAC TROMETHAMINE 30 MG/ML IJ SOLN
30.0000 mg | Freq: Once | INTRAMUSCULAR | Status: AC
  Administered 2013-06-06: 30 mg via INTRAVENOUS
  Filled 2013-06-06: qty 1

## 2013-06-06 MED ORDER — CYCLOBENZAPRINE HCL 10 MG PO TABS: 10.0000 mg | ORAL_TABLET | Freq: Two times a day (BID) | ORAL | Status: DC | PRN

## 2013-06-06 MED ORDER — HYDROMORPHONE HCL PF 1 MG/ML IJ SOLN
1.0000 mg | Freq: Once | INTRAMUSCULAR | Status: AC
  Administered 2013-06-06: 1 mg via INTRAVENOUS
  Filled 2013-06-06: qty 1

## 2013-06-06 MED ORDER — ONDANSETRON HCL 4 MG/2ML IJ SOLN
4.0000 mg | Freq: Once | INTRAMUSCULAR | Status: AC
  Administered 2013-06-06: 4 mg via INTRAVENOUS
  Filled 2013-06-06: qty 2

## 2013-06-06 MED ORDER — DIAZEPAM 5 MG PO TABS
5.0000 mg | ORAL_TABLET | Freq: Once | ORAL | Status: AC
  Administered 2013-06-06: 5 mg via ORAL
  Filled 2013-06-06: qty 1

## 2013-06-06 NOTE — ED Notes (Signed)
Family at bedside. 

## 2013-06-06 NOTE — ED Notes (Signed)
Pt here by ems for back and neck pain, pt has a fist sized swelling to right shoulder. Pain from area radiates to right neck upper back and pectoral area. Increases with movement and palpation. No injury noted.

## 2013-06-06 NOTE — ED Notes (Signed)
Returned from ct 

## 2013-06-06 NOTE — ED Notes (Signed)
Patient transported to CT 

## 2013-06-06 NOTE — ED Provider Notes (Signed)
CSN: [id number]     Arrival date & time 06/06/13  0359 History   First MD Initiated Contact with Patient 06/06/13 0423     Chief Complaint  Patient presents with  . Back Pain  . Torticollis   (Consider location/radiation/quality/duration/timing/severity/associated sxs/prior Treatment) HPI History provided by patient. Right-sided neck pain. With pain this morning and unable to sleep. Has history of arthritis, hip pain and back pain. He is taken prescription medications for that in the past which provided intermittent relief. Tonight he has sharp pain that radiates to his right shoulder, worse with movement. No fevers. No chills. No nausea vomiting or diarrhea. No trauma. No rash. Symptoms moderate to severe. Past Medical History  Diagnosis Date  . Chronic low back pain   . Tobacco dependence   . ETOH abuse     cociane abuse, tobacco abuse  . Arthritis     neck and left shoulder   History reviewed. No pertinent past surgical history. History reviewed. No pertinent family history. History  Substance Use Topics  . Smoking status: Former Smoker    Types: Cigarettes    Quit date: 03/04/2009  . Smokeless tobacco: Not on file  . Alcohol Use: 2.4 oz/week    4 Cans of beer per week     Comment: 3- 40oz per day    Review of Systems  Constitutional: Negative for fever and chills.  HENT: Positive for neck pain and neck stiffness.   Respiratory: Negative for shortness of breath.   Cardiovascular: Negative for leg swelling.  Gastrointestinal: Negative for vomiting and abdominal pain.  Genitourinary: Negative for dysuria.  Musculoskeletal: Negative for back pain.  Skin: Negative for rash.  Neurological: Negative for headaches.  All other systems reviewed and are negative.    Allergies  Review of patient's allergies indicates no known allergies.  Home Medications   Current Outpatient Rx  Name  Route  Sig  Dispense  Refill  . cyclobenzaprine (FLEXERIL) 10 MG tablet   Oral   Take  10 mg by mouth 3 (three) times daily as needed for muscle spasms.         Marland Kitchen ibuprofen (ADVIL,MOTRIN) 200 MG tablet   Oral   Take 400 mg by mouth daily as needed for pain (for back pain).           BP 125/79  Pulse 63  Temp(Src) 100 F (37.8 C) (Oral)  Resp 18  Ht 6' (1.829 m)  Wt 155 lb (70.308 kg)  BMI 21.02 kg/m2  SpO2 100% Physical Exam  Constitutional: He is oriented to person, place, and time. He appears well-developed and well-nourished.  HENT:  Head: Normocephalic and atraumatic.  Eyes: EOM are normal. Pupils are equal, round, and reactive to light.  Neck:  Tenderness right para cervical with muscle spasm. No midline deformity. No erythema or swelling. No tenderness over right shoulder does have decreased range of motion secondary to pain. Decreased range of motion with lateral rotation of his neck secondary to pain  Cardiovascular: Regular rhythm and intact distal pulses.   Pulmonary/Chest: Effort normal and breath sounds normal. No respiratory distress.  Reproducible tenderness right upper chest wall  Abdominal: Soft. He exhibits no distension. There is no tenderness.  Musculoskeletal:  Distal neurovascular intact x4. Soft nontender mobile lesion right posterior shoulder  Neurological: He is alert and oriented to person, place, and time.  Skin: Skin is warm and dry.    ED Course  Procedures (including critical care time) Labs Review Labs Reviewed  POCT I-STAT, CHEM 8 - Abnormal; Notable for the following:    Glucose, Bld 115 (*)    All other components within normal limits  CBC   Imaging Review Ct Cervical Spine Wo Contrast  06/06/2013   *RADIOLOGY REPORT*  Clinical Data: Back pain, torticollis  CT CERVICAL SPINE WITHOUT CONTRAST  Technique:  Multidetector CT imaging of the cervical spine was performed. Multiplanar CT image reconstructions were also generated.  Comparison: Prior radiograph from 06/09/2012  Findings: The vertebral bodies are normally aligned  with preservation of the normal lumbar lordosis.  Vertebral body heights are well preserved.  No prevertebral soft tissue swelling.  No acute fracture listhesis.  Normal C1-2 articulations are intact.  Multilevel degenerative disc disease is seen throughout the cervical spine with intervertebral disc space narrowing endplate sclerosis seen at C5-6 and C6-7.  No soft tissue abnormality identified.  IMPRESSION: 1.   No acute osseous abnormality identified within the cervical spine. 2.  Multilevel degenerative disc disease, most severe at C5-6 and C6-7.   Original Report Authenticated By: Rise Mu, M.D.    6:41 AM on recheck pain significantly improved, still has some discomfort but able to move his neck much better. CT results shared with patient. Plan discharge home with outpatient referral, prescriptions and strict return precautions which were verbalized as understood. MDM  Diagnosis: Right neck muscle strain CT scan, labs reviewed as above Improved with IV Dilaudid, Toradol and oral valium Vital signs and nursing notes reviewed and considered     Sunnie Nielsen, MD 06/06/13 [phone]

## 2013-07-04 ENCOUNTER — Ambulatory Visit: Payer: Medicaid Other | Attending: Internal Medicine | Admitting: Internal Medicine

## 2013-07-04 VITALS — BP 119/76 | HR 82 | Temp 98.2°F | Resp 18 | Wt 134.2 lb

## 2013-07-04 DIAGNOSIS — M549 Dorsalgia, unspecified: Secondary | ICD-10-CM

## 2013-07-04 DIAGNOSIS — G8929 Other chronic pain: Secondary | ICD-10-CM | POA: Insufficient documentation

## 2013-07-04 MED ORDER — CYCLOBENZAPRINE HCL 10 MG PO TABS: 10.0000 mg | ORAL_TABLET | Freq: Three times a day (TID) | ORAL | Status: DC | PRN

## 2013-07-04 MED ORDER — OXYCODONE-ACETAMINOPHEN 5-325 MG PO TABS: 1.0000 | ORAL_TABLET | Freq: Three times a day (TID) | ORAL | Status: DC | PRN

## 2013-07-04 NOTE — Progress Notes (Signed)
Here for back pain hip pain And arthritis- states pain meds he is on is not working

## 2013-07-04 NOTE — Progress Notes (Signed)
Patient ID: Lucas Walker, male   DOB: [birth year], 52 y.o.   MRN: [id number]   CC: Refill of medicine  HPI: Patient is 52 year old male who presents to clinic for followup requested refill of medicine referral to pain clinic. He reports persistent lower back pain, several years in duration, throbbing and intermittent, 5/10 in severity when present and not radiating. Patient denies any specific focal neurological symptoms, no numbness and tingling, no fevers and chills. No Known Allergies Past Medical History  Diagnosis Date  . Chronic low back pain   . Tobacco dependence   . ETOH abuse     cociane abuse, tobacco abuse  . Arthritis     neck and left shoulder   Current Outpatient Prescriptions on File Prior to Visit  Medication Sig Dispense Refill  . ibuprofen (ADVIL,MOTRIN) 200 MG tablet Take 400 mg by mouth daily as needed for pain (for back pain).       Marland Kitchen ibuprofen (ADVIL,MOTRIN) 600 MG tablet Take 1 tablet (600 mg total) by mouth every 6 (six) hours as needed for pain.  30 tablet  0  . traMADol (ULTRAM) 50 MG tablet Take 1 tablet (50 mg total) by mouth every 6 (six) hours as needed for pain.  15 tablet  0   No current facility-administered medications on file prior to visit.   No family medical history History   Social History  . Marital Status: Single    Spouse Name: N/A    Number of Children: N/A  . Years of Education: N/A   Occupational History  . Not on file.   Social History Main Topics  . Smoking status: Former Smoker    Types: Cigarettes    Quit date: 03/04/2009  . Smokeless tobacco: Not on file  . Alcohol Use: 2.4 oz/week    4 Cans of beer per week     Comment: 3- 40oz per day  . Drug Use: Yes     Comment: denies cociane use presently but UDS positive  . Sexual Activity: Not on file   Other Topics Concern  . Not on file   Social History Narrative  . No narrative on file    Review of Systems  Constitutional: Negative for fever, chills, diaphoresis,  activity change, appetite change and fatigue.  HENT: Negative for ear pain, nosebleeds, congestion, facial swelling, rhinorrhea, neck pain, neck stiffness and ear discharge.   Eyes: Negative for pain, discharge, redness, itching and visual disturbance.  Respiratory: Negative for cough, choking, chest tightness, shortness of breath, wheezing and stridor.   Cardiovascular: Negative for chest pain, palpitations and leg swelling.  Gastrointestinal: Negative for abdominal distention.  Genitourinary: Negative for dysuria, urgency, frequency, hematuria, flank pain, decreased urine volume, difficulty urinating and dyspareunia.  Musculoskeletal: Negative for back pain, joint swelling, arthralgias and gait problem.  Neurological: Negative for dizziness, tremors, seizures, syncope, facial asymmetry, speech difficulty, weakness, light-headedness, numbness and headaches.  Hematological: Negative for adenopathy. Does not bruise/bleed easily.  Psychiatric/Behavioral: Negative for hallucinations, behavioral problems, confusion, dysphoric mood, decreased concentration and agitation.    Objective:   Filed Vitals:   07/04/13 1159  BP: 119/76  Pulse: 82  Temp: 98.2 F (36.8 C)  Resp: 18    Physical Exam  Constitutional: Appears well-developed and well-nourished. No distress.  CVS: RRR, S1/S2 +, no murmurs, no gallops, no carotid bruit.  Pulmonary: Effort and breath sounds normal, no stridor, rhonchi, wheezes, rales.  Abdominal: Soft. BS +,  no distension, tenderness, rebound or guarding.  Musculoskeletal:  Normal range of motion. No edema and no tenderness.    Lab Results  Component Value Date   WBC 9.6 06/06/2013   HGB 16.7 06/06/2013   HCT 49.0 06/06/2013   MCV 94.5 06/06/2013   PLT 235 06/06/2013   Lab Results  Component Value Date   CREATININE 1.10 06/06/2013   BUN 10 06/06/2013   NA 140 06/06/2013   K 3.8 06/06/2013   CL 104 06/06/2013   CO2 25 10/10/2009    No results found for this basename:  HGBA1C   Lipid Panel     Component Value Date/Time   CHOL 153 10/10/2009 2024   TRIG 54 10/10/2009 2024   HDL 59 10/10/2009 2024   CHOLHDL 2.6 Ratio 10/10/2009 2024   VLDL 11 10/10/2009 2024   LDLCALC 83 10/10/2009 2024       Assessment and plan:   Patient Active Problem List   Diagnosis Date Noted  . BACK PAIN, CHRONIC - continue analgesia as needed, muscle relaxants, continue physical therapy and provide referral to pain clinic 07/11/2008

## 2013-07-04 NOTE — Patient Instructions (Signed)
Back Pain, Adult  Low back pain is very common. About 1 in 5 people have back pain. The cause of low back pain is rarely dangerous. The pain often gets better over time. About half of people with a sudden onset of back pain feel better in just 2 weeks. About 8 in 10 people feel better by 6 weeks.   CAUSES  Some common causes of back pain include:  · Strain of the muscles or ligaments supporting the spine.  · Wear and tear (degeneration) of the spinal discs.  · Arthritis.  · Direct injury to the back.  DIAGNOSIS  Most of the time, the direct cause of low back pain is not known. However, back pain can be treated effectively even when the exact cause of the pain is unknown. Answering your caregiver's questions about your overall health and symptoms is one of the most accurate ways to make sure the cause of your pain is not dangerous. If your caregiver needs more information, he or she may order lab work or imaging tests (X-rays or MRIs). However, even if imaging tests show changes in your back, this usually does not require surgery.  HOME CARE INSTRUCTIONS  For many people, back pain returns. Since low back pain is rarely dangerous, it is often a condition that people can learn to manage on their own.   · Remain active. It is stressful on the back to sit or stand in one place. Do not sit, drive, or stand in one place for more than 30 minutes at a time. Take short walks on level surfaces as soon as pain allows. Try to increase the length of time you walk each day.  · Do not stay in bed. Resting more than 1 or 2 days can delay your recovery.  · Do not avoid exercise or work. Your body is made to move. It is not dangerous to be active, even though your back may hurt. Your back will likely heal faster if you return to being active before your pain is gone.  · Pay attention to your body when you  bend and lift. Many people have less discomfort when lifting if they bend their knees, keep the load close to their bodies, and  avoid twisting. Often, the most comfortable positions are those that put less stress on your recovering back.  · Find a comfortable position to sleep. Use a firm mattress and lie on your side with your knees slightly bent. If you lie on your back, put a pillow under your knees.  · Only take over-the-counter or prescription medicines as directed by your caregiver. Over-the-counter medicines to reduce pain and inflammation are often the most helpful. Your caregiver may prescribe muscle relaxant drugs. These medicines help dull your pain so you can more quickly return to your normal activities and healthy exercise.  · Put ice on the injured area.  · Put ice in a plastic bag.  · Place a towel between your skin and the bag.  · Leave the ice on for 15-20 minutes, 3-4 times a day for the first 2 to 3 days. After that, ice and heat may be alternated to reduce pain and spasms.  · Ask your caregiver about trying back exercises and gentle massage. This may be of some benefit.  · Avoid feeling anxious or stressed. Stress increases muscle tension and can worsen back pain. It is important to recognize when you are anxious or stressed and learn ways to manage it. Exercise is a great option.  SEEK MEDICAL CARE IF:  · You have pain that is not relieved with rest or   medicine.  · You have pain that does not improve in 1 week.  · You have new symptoms.  · You are generally not feeling well.  SEEK IMMEDIATE MEDICAL CARE IF:   · You have pain that radiates from your back into your legs.  · You develop new bowel or bladder control problems.  · You have unusual weakness or numbness in your arms or legs.  · You develop nausea or vomiting.  · You develop abdominal pain.  · You feel faint.  Document Released: 08/25/2005 Document Revised: 02/24/2012 Document Reviewed: 01/13/2011  Exit[employer]® Patient Information ©2014 [employer].

## 2013-07-14 ENCOUNTER — Other Ambulatory Visit: Payer: Self-pay | Admitting: Emergency Medicine

## 2013-07-14 MED ORDER — TRAMADOL HCL 50 MG PO TABS: 50.0000 mg | ORAL_TABLET | Freq: Four times a day (QID) | ORAL | Status: DC | PRN

## 2014-06-19 ENCOUNTER — Encounter (HOSPITAL_COMMUNITY): Payer: Self-pay | Admitting: Emergency Medicine

## 2014-06-19 ENCOUNTER — Emergency Department (HOSPITAL_COMMUNITY)
Admission: EM | Admit: 2014-06-19 | Discharge: 2014-06-19 | Disposition: A | Discharge status: Home / Self Care | Payer: Medicaid Other | Attending: Emergency Medicine | Admitting: Emergency Medicine

## 2014-06-19 DIAGNOSIS — M542 Cervicalgia: Secondary | ICD-10-CM | POA: Diagnosis not present

## 2014-06-19 DIAGNOSIS — Z791 Long term (current) use of non-steroidal anti-inflammatories (NSAID): Secondary | ICD-10-CM | POA: Diagnosis not present

## 2014-06-19 DIAGNOSIS — M25512 Pain in left shoulder: Secondary | ICD-10-CM | POA: Diagnosis present

## 2014-06-19 DIAGNOSIS — Z87891 Personal history of nicotine dependence: Secondary | ICD-10-CM | POA: Diagnosis not present

## 2014-06-19 DIAGNOSIS — G8929 Other chronic pain: Secondary | ICD-10-CM | POA: Insufficient documentation

## 2014-06-19 DIAGNOSIS — M1990 Unspecified osteoarthritis, unspecified site: Secondary | ICD-10-CM | POA: Insufficient documentation

## 2014-06-19 MED ORDER — HYDROCODONE-ACETAMINOPHEN 5-325 MG PO TABS: 1.0000 | ORAL_TABLET | ORAL | Status: DC | PRN

## 2014-06-19 MED ORDER — IBUPROFEN 800 MG PO TABS: 800.0000 mg | ORAL_TABLET | Freq: Three times a day (TID) | ORAL | Status: DC

## 2014-06-19 NOTE — ED Provider Notes (Signed)
Medical screening examination/treatment/procedure(s) were performed by non-physician practitioner and as supervising physician I was immediately available for consultation/collaboration.   EKG Interpretation None        Pamella Pert, MD 06/19/14 2007

## 2014-06-19 NOTE — ED Provider Notes (Signed)
CSN: [id number]     Arrival date & time 06/19/14  1115 History  This chart was scribed for non-physician practitioner Charlann Lange, PA-C working with Pamella Pert, MD by Lora Havens, ED Scribe. This patient was seen in [facility] and the patient's care was started at 12:56 PM.  Chief Complaint  Patient presents with  . Shoulder Pain   Patient is a 53 y.o. male presenting with shoulder pain. The history is provided by the patient. No language interpreter was used.  Shoulder Pain Pertinent negatives include no chest pain, no abdominal pain and no shortness of breath.   HPI Comments: FENRIS CAUBLE is a 53 y.o. male with a history of arthritis who presents to the Emergency Department complaining of chronic left sided intermittent shoulder pain and neck pain. He describes it as a sharp, radiating down to his back and left leg rated as 5/10. PT states he is unable to lift his left arm which began last night around 11:30 PM. Pt notes that he was laying down watching TV when the pain suddenly started and it worsened through the night. He notes her tried Advil then fell asleep then when we woke up the pain was still there. Pt states he has not taken any pain medicine.  He notes this episode has happened before and he did nothing to it and the pain went away. Pt see a pain manager who prescribes him flexeril. Pt  denies HA, fever, CP, SOB, difficulty breathing, abdominal pain, problems/frequency with urination, hematuria, numbness in extremities, gait problems, dizziness.  Past Medical History  Diagnosis Date  . Chronic low back pain   . Tobacco dependence   . ETOH abuse     cociane abuse, tobacco abuse  . Arthritis     neck and left shoulder   History reviewed. No pertinent past surgical history. History reviewed. No pertinent family history. History  Substance Use Topics  . Smoking status: Former Smoker    Types: Cigarettes    Quit date: 03/04/2009  . Smokeless tobacco: Not on file  .  Alcohol Use: 2.4 oz/week    4 Cans of beer per week     Comment: 3- 40oz per day    Review of Systems  Constitutional: Negative for fever and chills.  Respiratory: Negative for shortness of breath.   Cardiovascular: Negative for chest pain.  Gastrointestinal: Negative for abdominal pain.  Genitourinary: Negative for dysuria, frequency, hematuria, decreased urine volume and difficulty urinating.  Musculoskeletal: Positive for arthralgias, myalgias and neck pain. Negative for gait problem.  Neurological: Negative for numbness.    Allergies  Review of patient's allergies indicates no known allergies.  Home Medications   Prior to Admission medications   Medication Sig Start Date End Date Taking? Authorizing Provider  cyclobenzaprine (FLEXERIL) 10 MG tablet Take 1 tablet (10 mg total) by mouth 3 (three) times daily as needed for muscle spasms. 07/04/13   Theodis Blaze, MD  ibuprofen (ADVIL,MOTRIN) 200 MG tablet Take 400 mg by mouth daily as needed for pain (for back pain).     Historical Provider, MD  ibuprofen (ADVIL,MOTRIN) 600 MG tablet Take 1 tablet (600 mg total) by mouth every 6 (six) hours as needed for pain. 06/06/13   Teressa Lower, MD  meloxicam (MOBIC) 7.5 MG tablet Take 7.5 mg by mouth daily.    Historical Provider, MD  oxyCODONE-acetaminophen (ROXICET) 5-325 MG per tablet Take 1 tablet by mouth every 8 (eight) hours as needed for pain. 07/04/13   Iskra  Barbera Setters, MD  traMADol (ULTRAM) 50 MG tablet Take 1 tablet (50 mg total) by mouth every 6 (six) hours as needed. 07/14/13   Robbie Lis, MD   BP 97/66  Pulse 88  Temp(Src) 98.1 F (36.7 C) (Oral)  Resp 19  Ht 6' (1.829 m)  Wt 155 lb (70.308 kg)  BMI 21.02 kg/m2  SpO2 99% Physical Exam  Nursing note and vitals reviewed. Constitutional: He is oriented to person, place, and time. He appears well-developed and well-nourished. No distress.  HENT:  Head: Normocephalic and atraumatic.  Eyes: EOM are normal.  Neck: Normal range  of motion.  Cardiovascular: Normal rate.   Pulses:      Radial pulses are 2+ on the right side, and 2+ on the left side.  Pulmonary/Chest: Effort normal and breath sounds normal. He has no wheezes. He has no rales.  Lungs clear to ascultation bilaterally.  Musculoskeletal:  Clavicle and AC joint non TTP. Limited ROM of left shoulder.   Neurological: He is alert and oriented to person, place, and time.  Skin: Skin is warm and dry.  Psychiatric: He has a normal mood and affect. His behavior is normal.    ED Course  Procedures DIAGNOSTIC STUDIES: Oxygen Saturation is 99% on room air, normal by my interpretation.    COORDINATION OF CARE: 1:09 PM Discussed treatment plan with pt at bedside and pt agreed to plan.   Labs Review Labs Reviewed - No data to display  Imaging Review No results found.   EKG Interpretation None      MDM   Final diagnoses:  None  I personally performed the services described in this documentation, which was scribed in my presence. The recorded information has been reviewed and is accurate.     1. Left shoulder pain  Patient with chronic shoulder pain on left without acute exacerbation. No acute conditions. Pain medication provided along with PCP referral.    Dewaine Oats, PA-C 06/19/14 1454

## 2014-06-19 NOTE — Discharge Instructions (Signed)
Adhesive Capsulitis Sometimes the shoulder becomes stiff and is painful to move. Some people say it feels as if the shoulder is frozen in place. Because of this, the condition is called "frozen shoulder." Its medical name is adhesive capsulitis.  The shoulder joint is made up of strong connective tissue that attaches the ball of the humerus to the shallow shoulder socket. This strong connective tissue is called the joint capsule. This tissue can become stiff and swollen. That is when adhesive capsulitis sets in. CAUSES  It is not always clear just what the cause adhesive capsulitis. Possibilities include:  Injury to the shoulder joint.  Strain. This is a repetitive injury brought about by overuse.  Lack of use. Perhaps your arm or hand was otherwise injured. It might have been in a sling for awhile. Or perhaps you were not using it to avoid pain.  Referred pain. This is a sort of trick the body plays. You feel pain in the shoulder. But, the pain actually comes from an injury somewhere else in the body.  Long-standing health problems. Several diseases can cause adhesive capsulitis. They include diabetes, heart disease, stroke, thyroid problems, rheumatoid arthritis and lung disease.  Being a women older than 1. Anyone can develop adhesive capsulitis but it is most common in women in this age group. SYMPTOMS   Pain.  It occurs when the arm is moved.  Parts of the shoulder might hurt if they are touched.  Pain is worse at night or when resting.  Soreness. It might not be strong enough to be called pain. But, the shoulder aches.  The shoulder does not move freely.  Muscle spasms.  Trouble sleeping because of shoulder ache or pain. DIAGNOSIS  To decide if you have adhesive capsulitis, your healthcare provider will probably:  Ask about symptoms you have noticed.  Ask about your history of joint pain and anything that might have caused the pain.  Ask about your overall  health.  Use hands to feel your shoulder and neck.  Ask you to move your shoulder in specific directions. This may indicate the origin of the pain.  Order imaging tests; pictures of the shoulder. They help pinpoint the source of the problem. An X-ray might be used. For more detail, an MRI is often used. An MRI details the tendons, muscles and ligaments as well as the joint. TREATMENT  Adhesive capsulitis can be treated several ways. Most treatments can be done in a clinic or in your healthcare provider's office. Be sure to discuss the different options with your caregiver. They include:  Physical therapy. You will work on specific exercises to get your shoulder moving again. The exercises usually involve stretching. A physical therapist (a caregiver with special training) can show you what to do and what not to do. The exercises will need to be done daily.  Medication.  Over-the-counter medicines may relieve pain and inflammation (the body's way of reacting to injury or infection).  Corticosteroids. These are stronger drugs to reduce pain and inflammation. They are given by injection (shots) into the shoulder joint. Frequent treatment is not recommended.  Muscle relaxants. Medication may be prescribed to ease muscle spasms.  Treatment of underlying conditions. This means treating another condition that is causing your shoulder problem. This might be a rotator cuff (tendon) problem  Shoulder manipulation. The shoulder will be moved by your healthcare provider. You would be under general anesthesia (given a drug that puts you to sleep). You would not feel anything. Sometimes  the joint will be injected with salt water (saline) at high pressure to break down internal scarring in the joint capsule.  Surgery. This is rarely needed. It may be suggested in advanced cases after all other treatment has failed. PROGNOSIS  In time, most people recover from adhesive capsulitis. Sometimes, however, the  pain goes away but full movement of the shoulder does not return.  HOME CARE INSTRUCTIONS   Take any pain medications recommended by your healthcare provider. Follow the directions carefully.  If you have physical therapy, follow through with the therapist's suggestions. Be sure you understand the exercises you will be doing. You should understand:  How often the exercises should be done.  How many times each exercise should be repeated.  How long they should be done.  What other activities you should do, or not do.  That you should warm up before doing any exercise. Just 5 to 10 minutes will help. Small, gentle movements should get your shoulder ready for more.  Avoid high-demand exercise that involves your shoulder such as throwing. This type of exercise can make pain worse.  Consider using cold packs. Cold may ease swelling and pain. Ask your healthcare provider if a cold pack might help you. If so, get directions on how and when to use them. SEEK MEDICAL CARE IF:   You have any questions about your medications.  Your pain continues to increase. Document Released: 06/22/2009 Document Revised: 11/17/2011 Document Reviewed: 06/22/2009 [facility] Patient Information 2015 [place], [place]. This information is not intended to replace advice given to you by your health care provider. Make sure you discuss any questions you have with your health care provider. Cryotherapy Cryotherapy means treatment with cold. Ice or gel packs can be used to reduce both pain and swelling. Ice is the most helpful within the first 24 to 48 hours after an injury or flare-up from overusing a muscle or joint. Sprains, strains, spasms, burning pain, shooting pain, and aches can all be eased with ice. Ice can also be used when recovering from surgery. Ice is effective, has very few side effects, and is safe for most people to use. PRECAUTIONS  Ice is not a safe treatment option for people with:  Raynaud phenomenon.  This is a condition affecting small blood vessels in the extremities. Exposure to cold may cause your problems to return.  Cold hypersensitivity. There are many forms of cold hypersensitivity, including:  Cold urticaria. Red, itchy hives appear on the skin when the tissues begin to warm after being iced.  Cold erythema. This is a red, itchy rash caused by exposure to cold.  Cold hemoglobinuria. Red blood cells break down when the tissues begin to warm after being iced. The hemoglobin that carry oxygen are passed into the urine because they cannot combine with blood proteins fast enough.  Numbness or altered sensitivity in the area being iced. If you have any of the following conditions, do not use ice until you have discussed cryotherapy with your caregiver:  Heart conditions, such as arrhythmia, angina, or chronic heart disease.  High blood pressure.  Healing wounds or open skin in the area being iced.  Current infections.  Rheumatoid arthritis.  Poor circulation.  Diabetes. Ice slows the blood flow in the region it is applied. This is beneficial when trying to stop inflamed tissues from spreading irritating chemicals to surrounding tissues. However, if you expose your skin to cold temperatures for too long or without the proper protection, you can damage your skin  or nerves. Watch for signs of skin damage due to cold. HOME CARE INSTRUCTIONS Follow these tips to use ice and cold packs safely.  Place a dry or damp towel between the ice and skin. A damp towel will cool the skin more quickly, so you may need to shorten the time that the ice is used.  For a more rapid response, add gentle compression to the ice.  Ice for no more than 10 to 20 minutes at a time. The bonier the area you are icing, the less time it will take to get the benefits of ice.  Check your skin after 5 minutes to make sure there are no signs of a poor response to cold or skin damage.  Rest 20 minutes or more  between uses.  Once your skin is numb, you can end your treatment. You can test numbness by very lightly touching your skin. The touch should be so light that you do not see the skin dimple from the pressure of your fingertip. When using ice, most people will feel these normal sensations in this order: cold, burning, aching, and numbness.  Do not use ice on someone who cannot communicate their responses to pain, such as small children or people with dementia. HOW TO MAKE AN ICE PACK Ice packs are the most common way to use ice therapy. Other methods include ice massage, ice baths, and cryosprays. Muscle creams that cause a cold, tingly feeling do not offer the same benefits that ice offers and should not be used as a substitute unless recommended by your caregiver. To make an ice pack, do one of the following:  Place crushed ice or a bag of frozen vegetables in a sealable plastic bag. Squeeze out the excess air. Place this bag inside another plastic bag. Slide the bag into a pillowcase or place a damp towel between your skin and the bag.  Mix 3 parts water with 1 part rubbing alcohol. Freeze the mixture in a sealable plastic bag. When you remove the mixture from the freezer, it will be slushy. Squeeze out the excess air. Place this bag inside another plastic bag. Slide the bag into a pillowcase or place a damp towel between your skin and the bag. SEEK MEDICAL CARE IF:  You develop white spots on your skin. This may give the skin a blotchy (mottled) appearance.  Your skin turns blue or pale.  Your skin becomes waxy or hard.  Your swelling gets worse. MAKE SURE YOU:   Understand these instructions.  Will watch your condition.  Will get help right away if you are not doing well or get worse. Document Released: 04/21/2011 Document Revised: 01/09/2014 Document Reviewed: 04/21/2011 [facility] Patient Information 2015 [place], [place]. This information is not intended to replace advice given to  you by your health care provider. Make sure you discuss any questions you have with your health care provider.

## 2014-06-19 NOTE — ED Notes (Signed)
Per EMS: pt from home c/o left shoulder pain that is chronic in nature; pt sts flexeril not helping; pt sts hx of OA

## 2014-08-05 ENCOUNTER — Encounter (HOSPITAL_COMMUNITY): Payer: Self-pay | Admitting: *Deleted

## 2014-08-05 ENCOUNTER — Emergency Department (HOSPITAL_COMMUNITY)
Admission: EM | Admit: 2014-08-05 | Discharge: 2014-08-05 | Disposition: A | Discharge status: Home / Self Care | Payer: Medicaid Other | Attending: Emergency Medicine | Admitting: Emergency Medicine

## 2014-08-05 DIAGNOSIS — G8929 Other chronic pain: Secondary | ICD-10-CM | POA: Insufficient documentation

## 2014-08-05 DIAGNOSIS — Z791 Long term (current) use of non-steroidal anti-inflammatories (NSAID): Secondary | ICD-10-CM | POA: Diagnosis not present

## 2014-08-05 DIAGNOSIS — M1990 Unspecified osteoarthritis, unspecified site: Secondary | ICD-10-CM | POA: Insufficient documentation

## 2014-08-05 DIAGNOSIS — M545 Low back pain: Secondary | ICD-10-CM | POA: Diagnosis not present

## 2014-08-05 DIAGNOSIS — Z87891 Personal history of nicotine dependence: Secondary | ICD-10-CM | POA: Insufficient documentation

## 2014-08-05 DIAGNOSIS — M549 Dorsalgia, unspecified: Secondary | ICD-10-CM

## 2014-08-05 MED ORDER — NAPROXEN 500 MG PO TABS: 500.0000 mg | ORAL_TABLET | Freq: Two times a day (BID) | ORAL | Status: DC

## 2014-08-05 NOTE — Discharge Instructions (Signed)
Back Pain, Adult Low back pain is very common. About 1 in 5 people have back pain.The cause of low back pain is rarely dangerous. The pain often gets better over time.About half of people with a sudden onset of back pain feel better in just 2 weeks. About 8 in 10 people feel better by 6 weeks.  CAUSES Some common causes of back pain include:  Strain of the muscles or ligaments supporting the spine.  Wear and tear (degeneration) of the spinal discs.  Arthritis.  Direct injury to the back. DIAGNOSIS Most of the time, the direct cause of low back pain is not known.However, back pain can be treated effectively even when the exact cause of the pain is unknown.Answering your caregiver's questions about your overall health and symptoms is one of the most accurate ways to make sure the cause of your pain is not dangerous. If your caregiver needs more information, he or she may order lab work or imaging tests (X-rays or MRIs).However, even if imaging tests show changes in your back, this usually does not require surgery. HOME CARE INSTRUCTIONS For many people, back pain returns.Since low back pain is rarely dangerous, it is often a condition that people can learn to manageon their own.   Remain active. It is stressful on the back to sit or stand in one place. Do not sit, drive, or stand in one place for more than 30 minutes at a time. Take short walks on level surfaces as soon as pain allows.Try to increase the length of time you walk each day.  Do not stay in bed.Resting more than 1 or 2 days can delay your recovery.  Do not avoid exercise or work.Your body is made to move.It is not dangerous to be active, even though your back may hurt.Your back will likely heal faster if you return to being active before your pain is gone.  Pay attention to your body when you bend and lift. Many people have less discomfortwhen lifting if they bend their knees, keep the load close to their bodies,and  avoid twisting. Often, the most comfortable positions are those that put less stress on your recovering back.  Find a comfortable position to sleep. Use a firm mattress and lie on your side with your knees slightly bent. If you lie on your back, put a pillow under your knees.  Only take over-the-counter or prescription medicines as directed by your caregiver. Over-the-counter medicines to reduce pain and inflammation are often the most helpful.Your caregiver may prescribe muscle relaxant drugs.These medicines help dull your pain so you can more quickly return to your normal activities and healthy exercise.  Put ice on the injured area.  Put ice in a plastic bag.  Place a towel between your skin and the bag.  Leave the ice on for 15-20 minutes, 03-04 times a day for the first 2 to 3 days. After that, ice and heat may be alternated to reduce pain and spasms.  Ask your caregiver about trying back exercises and gentle massage. This may be of some benefit.  Avoid feeling anxious or stressed.Stress increases muscle tension and can worsen back pain.It is important to recognize when you are anxious or stressed and learn ways to manage it.Exercise is a great option. SEEK MEDICAL CARE IF:  You have pain that is not relieved with rest or medicine.  You have pain that does not improve in 1 week.  You have new symptoms.  You are generally not feeling well. SEEK   IMMEDIATE MEDICAL CARE IF:   You have pain that radiates from your back into your legs.  You develop new bowel or bladder control problems.  You have unusual weakness or numbness in your arms or legs.  You develop nausea or vomiting.  You develop abdominal pain.  You feel faint. Document Released: 08/25/2005 Document Revised: 02/24/2012 Document Reviewed: 12/27/2013 [employer] Patient Information 2015 [employer]. This information is not intended to replace advice given to you by your health care provider. Make sure you  discuss any questions you have with your health care provider.  

## 2014-08-05 NOTE — ED Notes (Signed)
Declined W/C at D/C and was escorted to lobby by RN. 

## 2014-08-05 NOTE — ED Notes (Signed)
Pt reports hx of lower back pain, is out of meds and requesting refills. Ambulatory at triage.

## 2014-08-05 NOTE — ED Provider Notes (Signed)
CSN: [id number]     Arrival date & time 08/05/14  1213 History  This chart was scribed for Comer Locket, PA-C, working with Debby Freiberg, MD by Steva Colder, ED Scribe. The patient was seen in room TR10C/TR10C at 3:36 PM.    Chief Complaint  Patient presents with  . Back Pain     The history is provided by the patient. No language interpreter was used.   HPI Comments: Lucas Walker is a 53 y.o. male with a medical hx of chronic low back pain and arthritis who presents to the Emergency Department complaining of low back pain. It is hard for him to sleep at night. He is out of his medication that he uses for his chronic low back pain. He denies fevers, numbness, urinary incontinence and any other symptoms. Denies PCP. He goes to [facility] and [facility] for muscle relaxer. Denies hx of CA, IV drug use, steroid use. He is looking for a PCP.   Past Medical History  Diagnosis Date  . Chronic low back pain   . Tobacco dependence   . ETOH abuse     cociane abuse, tobacco abuse  . Arthritis     neck and left shoulder   History reviewed. No pertinent past surgical history. History reviewed. No pertinent family history. History  Substance Use Topics  . Smoking status: Former Smoker    Types: Cigarettes    Quit date: 03/04/2009  . Smokeless tobacco: Not on file  . Alcohol Use: 2.4 oz/week    4 Cans of beer per week     Comment: 3- 40oz per day    Review of Systems  Genitourinary:       No urinary incontinence  Musculoskeletal: Positive for back pain.  Neurological: Negative for numbness.  All other systems reviewed and are negative.     Allergies  Review of patient's allergies indicates no known allergies.  Home Medications   Prior to Admission medications   Medication Sig Start Date End Date Taking? Authorizing Provider  cyclobenzaprine (FLEXERIL) 10 MG tablet Take 1 tablet (10 mg total) by mouth 3 (three) times daily as needed for muscle spasms. 07/04/13  Yes  Theodis Blaze, MD  HYDROcodone-acetaminophen (NORCO/VICODIN) 5-325 MG per tablet Take 1-2 tablets by mouth every 4 (four) hours as needed. 06/19/14  Yes Shari A Upstill, PA-C  ibuprofen (ADVIL,MOTRIN) 800 MG tablet Take 1 tablet (800 mg total) by mouth 3 (three) times daily. 06/19/14  Yes Shari A Upstill, PA-C  ibuprofen (ADVIL,MOTRIN) 200 MG tablet Take 400 mg by mouth daily as needed for pain (for back pain).     Historical Provider, MD  ibuprofen (ADVIL,MOTRIN) 600 MG tablet Take 1 tablet (600 mg total) by mouth every 6 (six) hours as needed for pain. Patient not taking: Reported on 08/05/2014 06/06/13   Teressa Lower, MD  meloxicam (MOBIC) 7.5 MG tablet Take 7.5 mg by mouth daily.    Historical Provider, MD  naproxen (NAPROSYN) 500 MG tablet Take 1 tablet (500 mg total) by mouth 2 (two) times daily. 08/05/14   Verl Dicker, PA-C  oxyCODONE-acetaminophen (ROXICET) 5-325 MG per tablet Take 1 tablet by mouth every 8 (eight) hours as needed for pain. Patient not taking: Reported on 08/05/2014 07/04/13   Theodis Blaze, MD  traMADol (ULTRAM) 50 MG tablet Take 1 tablet (50 mg total) by mouth every 6 (six) hours as needed. Patient not taking: Reported on 08/05/2014 07/14/13   Robbie Lis, MD  BP 109/71 mmHg  Pulse 79  Temp(Src) 97.8 F (36.6 C) (Oral)  Resp 18  Ht 6' (1.829 m)  Wt 155 lb (70.308 kg)  BMI 21.02 kg/m2  SpO2 97%  Physical Exam  Constitutional: He is oriented to person, place, and time.  Awake, alert, nontoxic appearance with baseline speech.  HENT:  Head: Atraumatic.  Eyes: Pupils are equal, round, and reactive to light. Right eye exhibits no discharge. Left eye exhibits no discharge.  Neck: Neck supple.  Cardiovascular: Normal rate, regular rhythm and normal heart sounds.  Exam reveals no gallop and no friction rub.   No murmur heard. Pulmonary/Chest: Effort normal and breath sounds normal. No respiratory distress. He has no wheezes. He has no rales. He exhibits no  tenderness.  Abdominal: Soft. Bowel sounds are normal. He exhibits no mass. There is no tenderness. There is no rebound.  Musculoskeletal:       Thoracic back: He exhibits no tenderness.       Lumbar back: He exhibits no tenderness.  Bilateral lower extremities non tender without new rashes or color change, baseline ROM with intact DP / PT pulses, CR<2 secs all digits bilaterally, sensation baseline light touch bilaterally for pt, motor symmetric bilateral 5 / 5 hip flexion, quadriceps, hamstrings, EHL, foot dorsiflexion, foot plantarflexion, gait somewhat antalgic but without apparent new ataxia.  Neurological: He is alert and oriented to person, place, and time.  Mental status baseline for patient.  Upper extremity motor strength and sensation intact and symmetric bilaterally.  Skin: No rash noted.  Psychiatric: He has a normal mood and affect.  Nursing note and vitals reviewed.   ED Course  Procedures (including critical care time) DIAGNOSTIC STUDIES: Oxygen Saturation is 97% on room air, normal by my interpretation.    COORDINATION OF CARE: 3:41 PM-Discussed treatment plan which includes muscle relaxer and anti-inflammatory with pt at bedside and pt agreed to plan.   Labs Review Labs Reviewed - No data to display  Imaging Review No results found.   EKG Interpretation None      MDM  Vitals stable - WNL -afebrile Pt resting comfortably in ED. PE--not concerning for other acute or emergent pathology. No pathologic back pain red flags.  Will DC with Naproxen. Pt agrees. Discussed cannot refill chronic pain medicine. Pt agrees Discussed f/u with PCP and return precautions, pt very amenable to plan. Pt stable, in good condition and is appropriate for discharge. Pt ambulates out of ED without any difficulty.   Final diagnoses:  Bilateral back pain, unspecified location      I personally performed the services described in this documentation, which was scribed in my  presence. The recorded information has been reviewed and is accurate.    Viona Gilmore Salado, PA-C 08/05/14 2028  Debby Freiberg, MD 08/10/14 [phone]

## 2014-09-12 ENCOUNTER — Emergency Department (HOSPITAL_COMMUNITY): Payer: Medicaid Other

## 2014-09-12 ENCOUNTER — Encounter (HOSPITAL_COMMUNITY): Payer: Self-pay | Admitting: Emergency Medicine

## 2014-09-12 ENCOUNTER — Emergency Department (HOSPITAL_COMMUNITY)
Admission: EM | Admit: 2014-09-12 | Discharge: 2014-09-12 | Disposition: A | Discharge status: Home / Self Care | Payer: Medicaid Other | Attending: Emergency Medicine | Admitting: Emergency Medicine

## 2014-09-12 DIAGNOSIS — Z87891 Personal history of nicotine dependence: Secondary | ICD-10-CM | POA: Diagnosis not present

## 2014-09-12 DIAGNOSIS — M1990 Unspecified osteoarthritis, unspecified site: Secondary | ICD-10-CM | POA: Diagnosis not present

## 2014-09-12 DIAGNOSIS — G8929 Other chronic pain: Secondary | ICD-10-CM | POA: Diagnosis not present

## 2014-09-12 DIAGNOSIS — Z791 Long term (current) use of non-steroidal anti-inflammatories (NSAID): Secondary | ICD-10-CM | POA: Insufficient documentation

## 2014-09-12 DIAGNOSIS — R109 Unspecified abdominal pain: Secondary | ICD-10-CM

## 2014-09-12 DIAGNOSIS — R1013 Epigastric pain: Secondary | ICD-10-CM | POA: Diagnosis present

## 2014-09-12 DIAGNOSIS — Z79899 Other long term (current) drug therapy: Secondary | ICD-10-CM | POA: Diagnosis not present

## 2014-09-12 LAB — COMPREHENSIVE METABOLIC PANEL
ALK PHOS: 61 U/L (ref 39–117)
ALT: 9 U/L (ref 0–53)
AST: 18 U/L (ref 0–37)
Albumin: 3.9 g/dL (ref 3.5–5.2)
Anion gap: 7 (ref 5–15)
BILIRUBIN TOTAL: 0.6 mg/dL (ref 0.3–1.2)
BUN: 10 mg/dL (ref 6–23)
CHLORIDE: 105 meq/L (ref 96–112)
CO2: 27 mmol/L (ref 19–32)
Calcium: 9.5 mg/dL (ref 8.4–10.5)
Creatinine, Ser: 1.26 mg/dL (ref 0.50–1.35)
GFR, EST AFRICAN AMERICAN: 74 mL/min — AB (ref >90)
GFR, EST NON AFRICAN AMERICAN: 64 mL/min — AB (ref >90)
GLUCOSE: 135 mg/dL — AB (ref 70–99)
POTASSIUM: 3.9 mmol/L (ref 3.5–5.1)
SODIUM: 139 mmol/L (ref 135–145)
Total Protein: 6.5 g/dL (ref 6.0–8.3)

## 2014-09-12 LAB — CBC WITH DIFFERENTIAL/PLATELET
Basophils Absolute: 0 10*3/uL (ref 0.0–0.1)
Basophils Relative: 0 % (ref 0–1)
Eosinophils Absolute: 0.1 10*3/uL (ref 0.0–0.7)
Eosinophils Relative: 2 % (ref 0–5)
HCT: 41.6 % (ref 39.0–52.0)
Hemoglobin: 14.2 g/dL (ref 13.0–17.0)
LYMPHS ABS: 1.9 10*3/uL (ref 0.7–4.0)
Lymphocytes Relative: 28 % (ref 12–46)
MCH: 32.5 pg (ref 26.0–34.0)
MCHC: 34.1 g/dL (ref 30.0–36.0)
MCV: 95.2 fL (ref 78.0–100.0)
MONOS PCT: 8 % (ref 3–12)
Monocytes Absolute: 0.5 10*3/uL (ref 0.1–1.0)
NEUTROS ABS: 4.2 10*3/uL (ref 1.7–7.7)
NEUTROS PCT: 62 % (ref 43–77)
PLATELETS: 279 10*3/uL (ref 150–400)
RBC: 4.37 MIL/uL (ref 4.22–5.81)
RDW: 12.9 % (ref 11.5–15.5)
WBC: 6.7 10*3/uL (ref 4.0–10.5)

## 2014-09-12 LAB — LIPASE, BLOOD: Lipase: 31 U/L (ref 11–59)

## 2014-09-12 MED ORDER — FAMOTIDINE 20 MG PO TABS: 20.0000 mg | ORAL_TABLET | Freq: Two times a day (BID) | ORAL | Status: DC

## 2014-09-12 MED ORDER — GI COCKTAIL ~~LOC~~
30.0000 mL | Freq: Once | ORAL | Status: AC
  Administered 2014-09-12: 30 mL via ORAL
  Filled 2014-09-12: qty 30

## 2014-09-12 MED ORDER — OMEPRAZOLE 20 MG PO CPDR: 20.0000 mg | DELAYED_RELEASE_CAPSULE | Freq: Every day | ORAL | Status: DC

## 2014-09-12 NOTE — ED Notes (Signed)
Patient returned from CT

## 2014-09-12 NOTE — ED Notes (Addendum)
Pt reports epigastic pain that refers to shoulder blades for 1 week. States that he is no nauseated, diarr, vomiting. Drinking increased over holidays. Denies withdrawal symptoms or chest pain or SOB.

## 2014-09-12 NOTE — ED Provider Notes (Signed)
CSN: [id number]     Arrival date & time 09/12/14  1302 History   First MD Initiated Contact with Patient 09/12/14 1712     Chief Complaint  Patient presents with  . Abdominal Pain     (Consider location/radiation/quality/duration/timing/severity/associated sxs/prior Treatment) HPI Lucas Walker is a 54 y.o. male with hx of ETOH abuse, arthritis, chronic pain, presents to ED with complaint of epigastric abdominal pain. Pt states his pain began a week ago. States radiates to the back. Reports pain worsening with  Eating. Also states pain is increased with drink alcohol. He does report drinking alcohol, but states he does not drink daily. He denies any nausea or vomiting. Denies any diarrhea. No change in stool color. No chest pain or shortness of breath. Try to take Pepto-Bismol with no relief of his symptoms. He denies history of the same. He does not have a primary care doctor. No prior abdominal surgeries. He is a smoker. He admits to taking a lot of NSAIDs for chronic arthritis.   Past Medical History  Diagnosis Date  . Chronic low back pain   . Tobacco dependence   . ETOH abuse     cociane abuse, tobacco abuse  . Arthritis     neck and left shoulder   History reviewed. No pertinent past surgical history. History reviewed. No pertinent family history. History  Substance Use Topics  . Smoking status: Former Smoker    Types: Cigarettes    Quit date: 03/04/2009  . Smokeless tobacco: Not on file  . Alcohol Use: 2.4 oz/week    4 Cans of beer per week     Comment: 3- 40oz per day    Review of Systems  Constitutional: Negative for fever and chills.  Respiratory: Negative for cough, chest tightness and shortness of breath.   Cardiovascular: Negative for chest pain, palpitations and leg swelling.  Gastrointestinal: Positive for abdominal pain. Negative for nausea, vomiting, diarrhea and abdominal distention.  Genitourinary: Negative for dysuria, urgency, frequency and hematuria.   Musculoskeletal: Negative for myalgias, arthralgias, neck pain and neck stiffness.  Skin: Negative for rash.  Allergic/Immunologic: Negative for immunocompromised state.  Neurological: Negative for dizziness, weakness, light-headedness, numbness and headaches.  All other systems reviewed and are negative.     Allergies  Review of patient's allergies indicates no known allergies.  Home Medications   Prior to Admission medications   Medication Sig Start Date End Date Taking? Authorizing Provider  cyclobenzaprine (FLEXERIL) 10 MG tablet Take 1 tablet (10 mg total) by mouth 3 (three) times daily as needed for muscle spasms. 07/04/13   Theodis Blaze, MD  HYDROcodone-acetaminophen (NORCO/VICODIN) 5-325 MG per tablet Take 1-2 tablets by mouth every 4 (four) hours as needed. 06/19/14   Shari A Upstill, PA-C  ibuprofen (ADVIL,MOTRIN) 200 MG tablet Take 400 mg by mouth daily as needed for pain (for back pain).     Historical Provider, MD  ibuprofen (ADVIL,MOTRIN) 600 MG tablet Take 1 tablet (600 mg total) by mouth every 6 (six) hours as needed for pain. Patient not taking: Reported on 08/05/2014 06/06/13   Teressa Lower, MD  ibuprofen (ADVIL,MOTRIN) 800 MG tablet Take 1 tablet (800 mg total) by mouth 3 (three) times daily. 06/19/14   Shari A Upstill, PA-C  meloxicam (MOBIC) 7.5 MG tablet Take 7.5 mg by mouth daily.    Historical Provider, MD  naproxen (NAPROSYN) 500 MG tablet Take 1 tablet (500 mg total) by mouth 2 (two) times daily. 08/05/14   Viona Gilmore  Cartner, PA-C  oxyCODONE-acetaminophen (ROXICET) 5-325 MG per tablet Take 1 tablet by mouth every 8 (eight) hours as needed for pain. Patient not taking: Reported on 08/05/2014 07/04/13   Theodis Blaze, MD  traMADol (ULTRAM) 50 MG tablet Take 1 tablet (50 mg total) by mouth every 6 (six) hours as needed. Patient not taking: Reported on 08/05/2014 07/14/13   Robbie Lis, MD   BP 116/75 mmHg  Pulse 56  Temp(Src) 98.1 F (36.7 C) (Oral)  Resp 16   SpO2 100% Physical Exam  Constitutional: He appears well-developed and well-nourished. No distress.  HENT:  Head: Normocephalic and atraumatic.  Eyes: Conjunctivae are normal.  Neck: Neck supple.  Cardiovascular: Normal rate, regular rhythm and normal heart sounds.   Pulmonary/Chest: Effort normal. No respiratory distress. He has no wheezes. He has no rales.  Abdominal: Soft. Bowel sounds are normal. He exhibits no distension. There is tenderness. There is no rebound and no guarding.  Epigastric tenderness  Musculoskeletal: He exhibits no edema.  Neurological: He is alert.  Skin: Skin is warm and dry.  Nursing note and vitals reviewed.   ED Course  Procedures (including critical care time) Labs Review Labs Reviewed  COMPREHENSIVE METABOLIC PANEL - Abnormal; Notable for the following:    Glucose, Bld 135 (*)    GFR calc non Af Amer 64 (*)    GFR calc Af Amer 74 (*)    All other components within normal limits  CBC WITH DIFFERENTIAL  LIPASE, BLOOD    Imaging Review US Abdomen Complete  09/12/2014   CLINICAL DATA:  Midline abdominal pain  EXAM: ULTRASOUND ABDOMEN COMPLETE  COMPARISON:  None.  FINDINGS: Gallbladder: No gallstones or wall thickening visualized. No sonographic Murphy sign noted.  Common bile duct: Diameter: 5.9 mm.  Liver: No focal lesion identified. Within normal limits in parenchymal echogenicity.  IVC: No abnormality visualized.  Pancreas: Visualized portion unremarkable.  Spleen: Size and appearance within normal limits.  Right Kidney: Length: 9.6 cm. Echogenicity within normal limits. No mass or hydronephrosis visualized.  Left Kidney: Length: 9.7 cm. Echogenicity within normal limits. No mass or hydronephrosis visualized.  Abdominal aorta: No aneurysm visualized.  Other findings: None.  IMPRESSION: No acute abnormality noted.   Electronically Signed   By: Inez Catalina M.D.   On: 09/12/2014 20:34     EKG Interpretation None      MDM   Final diagnoses:   Abdominal pain    Patient with epigastric abdominal pain. He is a heavy alcohol drinker, also taking NSAIDs in a smoker. Multiple risk factors for gastritis/peptic ulcer disease. His abdomen is soft, and no guarding or rebound tenderness, doubt surgical abdomen at this time. Labs are unremarkable, will get ultrasound to rule out cholecystitis. Will try GI cocktail   8:54 PM Patient is pain-free after GI cocktail. Ultrasound is negative. Most likely gastritis/ peptic ulcer disease. Will start on Prilosec, Pepcid, Maalox. Follow-up with primary care doctor. Vital signs are normal patient is nontoxic  Filed Vitals:   09/12/14 1845 09/12/14 1900 09/12/14 1915 09/12/14 2040  BP: 94/57 107/75 111/77 107/75  Pulse: 69 82 67 65  Temp:      TempSrc:      Resp:    12  SpO2: 98% 100% 99% 98%       Renold Genta, PA-C 09/13/14 0114  Maudry Diego, MD 09/15/14 1327

## 2014-09-12 NOTE — Discharge Instructions (Signed)
Take maalox daily with meals as needed. prilosec and pepcid daily. Stop drinking, smoking. Avoid NSAID medications. Follow up with primary caer doctor.   Gastritis, Adult Gastritis is soreness and swelling (inflammation) of the lining of the stomach. Gastritis can develop as a sudden onset (acute) or long-term (chronic) condition. If gastritis is not treated, it can lead to stomach bleeding and ulcers. CAUSES  Gastritis occurs when the stomach lining is weak or damaged. Digestive juices from the stomach then inflame the weakened stomach lining. The stomach lining may be weak or damaged due to viral or bacterial infections. One common bacterial infection is the Helicobacter pylori infection. Gastritis can also result from excessive alcohol consumption, taking certain medicines, or having too much acid in the stomach.  SYMPTOMS  In some cases, there are no symptoms. When symptoms are present, they may include:  Pain or a burning sensation in the upper abdomen.  Nausea.  Vomiting.  An uncomfortable feeling of fullness after eating. DIAGNOSIS  Your caregiver may suspect you have gastritis based on your symptoms and a physical exam. To determine the cause of your gastritis, your caregiver may perform the following:  Blood or stool tests to check for the H pylori bacterium.  Gastroscopy. A thin, flexible tube (endoscope) is passed down the esophagus and into the stomach. The endoscope has a light and camera on the end. Your caregiver uses the endoscope to view the inside of the stomach.  Taking a tissue sample (biopsy) from the stomach to examine under a microscope. TREATMENT  Depending on the cause of your gastritis, medicines may be prescribed. If you have a bacterial infection, such as an H pylori infection, antibiotics may be given. If your gastritis is caused by too much acid in the stomach, H2 blockers or antacids may be given. Your caregiver may recommend that you stop taking aspirin,  ibuprofen, or other nonsteroidal anti-inflammatory drugs (NSAIDs). HOME CARE INSTRUCTIONS  Only take over-the-counter or prescription medicines as directed by your caregiver.  If you were given antibiotic medicines, take them as directed. Finish them even if you start to feel better.  Drink enough fluids to keep your urine clear or pale yellow.  Avoid foods and drinks that make your symptoms worse, such as:  Caffeine or alcoholic drinks.  Chocolate.  Peppermint or mint flavorings.  Garlic and onions.  Spicy foods.  Citrus fruits, such as oranges, lemons, or limes.  Tomato-based foods such as sauce, chili, salsa, and pizza.  Fried and fatty foods.  Eat small, frequent meals instead of large meals. SEEK IMMEDIATE MEDICAL CARE IF:   You have black or dark red stools.  You vomit blood or material that looks like coffee grounds.  You are unable to keep fluids down.  Your abdominal pain gets worse.  You have a fever.  You do not feel better after 1 week.  You have any other questions or concerns. MAKE SURE YOU:  Understand these instructions.  Will watch your condition.  Will get help right away if you are not doing well or get worse. Document Released: 08/19/2001 Document Revised: 02/24/2012 Document Reviewed: 10/08/2011 [facility] Patient Information 2015 [place], [place]. This information is not intended to replace advice given to you by your health care provider. Make sure you discuss any questions you have with your health care provider.

## 2014-09-12 NOTE — ED Notes (Signed)
Pt a/o x 4 on d/c with steady gait. 

## 2014-11-25 ENCOUNTER — Encounter (HOSPITAL_COMMUNITY): Payer: Self-pay | Admitting: Emergency Medicine

## 2014-11-25 ENCOUNTER — Emergency Department (HOSPITAL_COMMUNITY)
Admission: EM | Admit: 2014-11-25 | Discharge: 2014-11-25 | Disposition: A | Discharge status: Home / Self Care | Payer: Medicaid Other | Attending: Emergency Medicine | Admitting: Emergency Medicine

## 2014-11-25 DIAGNOSIS — Z87891 Personal history of nicotine dependence: Secondary | ICD-10-CM | POA: Diagnosis not present

## 2014-11-25 DIAGNOSIS — Z79899 Other long term (current) drug therapy: Secondary | ICD-10-CM | POA: Insufficient documentation

## 2014-11-25 DIAGNOSIS — M1990 Unspecified osteoarthritis, unspecified site: Secondary | ICD-10-CM | POA: Insufficient documentation

## 2014-11-25 DIAGNOSIS — M542 Cervicalgia: Secondary | ICD-10-CM | POA: Diagnosis present

## 2014-11-25 DIAGNOSIS — M25511 Pain in right shoulder: Secondary | ICD-10-CM | POA: Insufficient documentation

## 2014-11-25 DIAGNOSIS — G8929 Other chronic pain: Secondary | ICD-10-CM | POA: Diagnosis not present

## 2014-11-25 MED ORDER — IBUPROFEN 400 MG PO TABS
600.0000 mg | ORAL_TABLET | Freq: Once | ORAL | Status: AC
Start: 2014-11-25 — End: 2014-11-25
  Administered 2014-11-25: 600 mg via ORAL
  Filled 2014-11-25: qty 2

## 2014-11-25 MED ORDER — MELOXICAM 7.5 MG PO TABS: 7.5000 mg | ORAL_TABLET | Freq: Every day | ORAL | Status: DC | PRN

## 2014-11-25 NOTE — ED Notes (Signed)
Out of pain meds, needs Vicodin prescription.

## 2014-11-25 NOTE — ED Provider Notes (Signed)
CSN: [id number]     Arrival date & time 11/25/14  1050 History  This chart was scribed for non-physician practitioner, Clayton Bibles, PA-C working with Blanchie Dessert, MD by Judithann Sauger, ED Scribe. The patient was seen in room TR07C/TR07C and the patient's care was started at 12:08 PM      Chief Complaint  Patient presents with  . Neck Pain  . Back Pain   The history is provided by the patient. No language interpreter was used.   HPI Comments: Lucas Walker is a 54 y.o. male with a hx of chronic low back pain and arthritis in his neck and left shoulder who presents to the Emergency Department complaining of an achy pain in his neck that radiates to his right shoulder and right arm onset today. He reports that he has an arthritis flare up about once or twice a month. He denies any fever, chills, body aches, bowel or bladder incontinence, CP, SOB, abdominal pain, and N/V/D. He also c/o intermittent left hip pain that radiates toward his knee. He reports that sometimes the cold makes his pain worse.   Past Medical History  Diagnosis Date  . Chronic low back pain   . Tobacco dependence   . ETOH abuse     cociane abuse, tobacco abuse  . Arthritis     neck and left shoulder   History reviewed. No pertinent past surgical history. No family history on file. History  Substance Use Topics  . Smoking status: Former Smoker    Types: Cigarettes    Quit date: 03/04/2009  . Smokeless tobacco: Not on file  . Alcohol Use: 2.4 oz/week    4 Cans of beer per week     Comment: 3- 40oz per day    Review of Systems  Constitutional: Negative for fever and chills.  Respiratory: Negative for shortness of breath.   Cardiovascular: Negative for chest pain.  Gastrointestinal: Negative for nausea, vomiting, abdominal pain and diarrhea.  Musculoskeletal: Positive for arthralgias (right shoulder and right arm) and neck pain.  Skin: Negative for color change.  Allergic/Immunologic: Negative for  immunocompromised state.  Neurological: Negative for weakness, numbness and headaches.  Hematological: Does not bruise/bleed easily.  Psychiatric/Behavioral: Negative for self-injury.      Allergies  Review of patient's allergies indicates no known allergies.  Home Medications   Prior to Admission medications   Medication Sig Start Date End Date Taking? Authorizing Provider  cyclobenzaprine (FLEXERIL) 10 MG tablet Take 1 tablet (10 mg total) by mouth 3 (three) times daily as needed for muscle spasms. 07/04/13   Theodis Blaze, MD  famotidine (PEPCID) 20 MG tablet Take 1 tablet (20 mg total) by mouth 2 (two) times daily. 09/12/14   Tatyana Kirichenko, PA-C  HYDROcodone-acetaminophen (NORCO/VICODIN) 5-325 MG per tablet Take 1-2 tablets by mouth every 4 (four) hours as needed. 06/19/14   Charlann Lange, PA-C  ibuprofen (ADVIL,MOTRIN) 200 MG tablet Take 400 mg by mouth daily as needed for pain (for back pain).     Historical Provider, MD  ibuprofen (ADVIL,MOTRIN) 600 MG tablet Take 1 tablet (600 mg total) by mouth every 6 (six) hours as needed for pain. 06/06/13   Teressa Lower, MD  ibuprofen (ADVIL,MOTRIN) 800 MG tablet Take 1 tablet (800 mg total) by mouth 3 (three) times daily. 06/19/14   Charlann Lange, PA-C  meloxicam (MOBIC) 7.5 MG tablet Take 7.5 mg by mouth daily.    Historical Provider, MD  naproxen (NAPROSYN) 500 MG tablet Take 1  tablet (500 mg total) by mouth 2 (two) times daily. 08/05/14   Comer Locket, PA-C  omeprazole (PRILOSEC) 20 MG capsule Take 1 capsule (20 mg total) by mouth daily. 09/12/14   Tatyana Kirichenko, PA-C  oxyCODONE-acetaminophen (ROXICET) 5-325 MG per tablet Take 1 tablet by mouth every 8 (eight) hours as needed for pain. 07/04/13   Theodis Blaze, MD  traMADol (ULTRAM) 50 MG tablet Take 1 tablet (50 mg total) by mouth every 6 (six) hours as needed. 07/14/13   Robbie Lis, MD   BP 96/72 mmHg  Pulse 62  Temp(Src) 97.8 F (36.6 C) (Oral)  Resp 18  SpO2  99% Physical Exam  Constitutional: He appears well-developed and well-nourished. No distress.  HENT:  Head: Normocephalic and atraumatic.  Neck: Neck supple.  Cardiovascular: Normal rate, regular rhythm and normal heart sounds.   Pulmonary/Chest: Effort normal and breath sounds normal.  Musculoskeletal:  Diffuse tenderness throughout C spine, no crepitus, no step-off. Tenderness of the bilateral trapezius.  Upper extremities:  Strength 5/5, sensation intact, distal pulses intact.      Neurological: He is alert.  Skin: He is not diaphoretic.  Nursing note and vitals reviewed.   ED Course  Procedures (including critical care time) DIAGNOSTIC STUDIES: Oxygen Saturation is 99% on RA, normal by my interpretation.    COORDINATION OF CARE: 12:17 PM- Pt advised of plan for treatment and pt agrees.    Labs Review Labs Reviewed - No data to display  Imaging Review No results found.   EKG Interpretation None      MDM   Final diagnoses:  Chronic neck pain  Chronic right shoulder pain    Afebrile, nontoxic patient with exacerbation of chronic back pain.  No red flags with history or exam.  Neurovascularly intact.  Emergent imaging not indicated at this time.   D/C home with mobic, [facility] f/u.  Discussed result, findings, treatment, and follow up  with patient.  Pt given return precautions.  Pt verbalizes understanding and agrees with plan.          I personally performed the services described in this documentation, which was scribed in my presence. The recorded information has been reviewed and is accurate.   Clayton Bibles, PA-C 11/25/14 1334  Blanchie Dessert, MD 11/25/14 1418

## 2014-11-25 NOTE — Discharge Instructions (Signed)
Read the information below.  Use the prescribed medication as directed.  Please discuss all new medications with your pharmacist.  You may return to the Emergency Department at any time for worsening condition or any new symptoms that concern you.    If you develop uncontrolled pain, weakness or numbness of the extremity, severe discoloration of the skin, or you are unable to move your neck or use your arm, return to the ER for a recheck.      Arthritis, Nonspecific Arthritis is inflammation of a joint. This usually means pain, redness, warmth or swelling are present. One or more joints may be involved. There are a number of types of arthritis. Your caregiver may not be able to tell what type of arthritis you have right away. CAUSES  The most common cause of arthritis is the wear and tear on the joint (osteoarthritis). This causes damage to the cartilage, which can break down over time. The knees, hips, back and neck are most often affected by this type of arthritis. Other types of arthritis and common causes of joint pain include:  Sprains and other injuries near the joint. Sometimes minor sprains and injuries cause pain and swelling that develop hours later.  Rheumatoid arthritis. This affects hands, feet and knees. It usually affects both sides of your body at the same time. It is often associated with chronic ailments, fever, weight loss and general weakness.  Crystal arthritis. Gout and pseudo gout can cause occasional acute severe pain, redness and swelling in the foot, ankle, or knee.  Infectious arthritis. Bacteria can get into a joint through a break in overlying skin. This can cause infection of the joint. Bacteria and viruses can also spread through the blood and affect your joints.  Drug, infectious and allergy reactions. Sometimes joints can become mildly painful and slightly swollen with these types of illnesses. SYMPTOMS   Pain is the main symptom.  Your joint or joints can also  be red, swollen and warm or hot to the touch.  You may have a fever with certain types of arthritis, or even feel overall ill.  The joint with arthritis will hurt with movement. Stiffness is present with some types of arthritis. DIAGNOSIS  Your caregiver will suspect arthritis based on your description of your symptoms and on your exam. Testing may be needed to find the type of arthritis:  Blood and sometimes urine tests.  X-ray tests and sometimes CT or MRI scans.  Removal of fluid from the joint (arthrocentesis) is done to check for bacteria, crystals or other causes. Your caregiver (or a specialist) will numb the area over the joint with a local anesthetic, and use a needle to remove joint fluid for examination. This procedure is only minimally uncomfortable.  Even with these tests, your caregiver may not be able to tell what kind of arthritis you have. Consultation with a specialist (rheumatologist) may be helpful. TREATMENT  Your caregiver will discuss with you treatment specific to your type of arthritis. If the specific type cannot be determined, then the following general recommendations may apply. Treatment of severe joint pain includes:  Rest.  Elevation.  Anti-inflammatory medication (for example, ibuprofen) may be prescribed. Avoiding activities that cause increased pain.  Only take over-the-counter or prescription medicines for pain and discomfort as recommended by your caregiver.  Cold packs over an inflamed joint may be used for 10 to 15 minutes every hour. Hot packs sometimes feel better, but do not use overnight. Do not use hot  packs if you are diabetic without your caregiver's permission.  A cortisone shot into arthritic joints may help reduce pain and swelling.  Any acute arthritis that gets worse over the next 1 to 2 days needs to be looked at to be sure there is no joint infection. Long-term arthritis treatment involves modifying activities and lifestyle to reduce  joint stress jarring. This can include weight loss. Also, exercise is needed to nourish the joint cartilage and remove waste. This helps keep the muscles around the joint strong. HOME CARE INSTRUCTIONS   Do not take aspirin to relieve pain if gout is suspected. This elevates uric acid levels.  Only take over-the-counter or prescription medicines for pain, discomfort or fever as directed by your caregiver.  Rest the joint as much as possible.  If your joint is swollen, keep it elevated.  Use crutches if the painful joint is in your leg.  Drinking plenty of fluids may help for certain types of arthritis.  Follow your caregiver's dietary instructions.  Try low-impact exercise such as:  Swimming.  Water aerobics.  Biking.  Walking.  Morning stiffness is often relieved by a warm shower.  Put your joints through regular range-of-motion. SEEK MEDICAL CARE IF:   You do not feel better in 24 hours or are getting worse.  You have side effects to medications, or are not getting better with treatment. SEEK IMMEDIATE MEDICAL CARE IF:   You have a fever.  You develop severe joint pain, swelling or redness.  Many joints are involved and become painful and swollen.  There is severe back pain and/or leg weakness.  You have loss of bowel or bladder control. Document Released: 10/02/2004 Document Revised: 11/17/2011 Document Reviewed: 10/18/2008 [facility] Patient Information 2015 [place], [place]. This information is not intended to replace advice given to you by your health care provider. Make sure you discuss any questions you have with your health care provider.

## 2014-11-25 NOTE — ED Notes (Signed)
Pt. Stated, My arthritis is flaring up today. My back and neck is hurting.

## 2015-06-04 ENCOUNTER — Encounter (HOSPITAL_COMMUNITY): Payer: Self-pay | Admitting: Cardiology

## 2015-06-04 ENCOUNTER — Emergency Department (HOSPITAL_COMMUNITY)
Admission: EM | Admit: 2015-06-04 | Discharge: 2015-06-04 | Disposition: A | Discharge status: Home / Self Care | Payer: Medicaid Other | Attending: Emergency Medicine | Admitting: Emergency Medicine

## 2015-06-04 ENCOUNTER — Emergency Department (HOSPITAL_COMMUNITY): Payer: Medicaid Other

## 2015-06-04 DIAGNOSIS — R1031 Right lower quadrant pain: Secondary | ICD-10-CM | POA: Diagnosis not present

## 2015-06-04 DIAGNOSIS — M47812 Spondylosis without myelopathy or radiculopathy, cervical region: Secondary | ICD-10-CM | POA: Insufficient documentation

## 2015-06-04 DIAGNOSIS — R350 Frequency of micturition: Secondary | ICD-10-CM | POA: Insufficient documentation

## 2015-06-04 DIAGNOSIS — R1030 Lower abdominal pain, unspecified: Secondary | ICD-10-CM | POA: Insufficient documentation

## 2015-06-04 DIAGNOSIS — M25552 Pain in left hip: Secondary | ICD-10-CM | POA: Diagnosis not present

## 2015-06-04 DIAGNOSIS — M545 Low back pain: Secondary | ICD-10-CM | POA: Diagnosis not present

## 2015-06-04 DIAGNOSIS — R1013 Epigastric pain: Secondary | ICD-10-CM | POA: Diagnosis not present

## 2015-06-04 DIAGNOSIS — G8929 Other chronic pain: Secondary | ICD-10-CM | POA: Diagnosis not present

## 2015-06-04 DIAGNOSIS — Z87891 Personal history of nicotine dependence: Secondary | ICD-10-CM | POA: Diagnosis not present

## 2015-06-04 DIAGNOSIS — M19012 Primary osteoarthritis, left shoulder: Secondary | ICD-10-CM | POA: Insufficient documentation

## 2015-06-04 DIAGNOSIS — R1032 Left lower quadrant pain: Secondary | ICD-10-CM | POA: Insufficient documentation

## 2015-06-04 DIAGNOSIS — R1012 Left upper quadrant pain: Secondary | ICD-10-CM | POA: Insufficient documentation

## 2015-06-04 DIAGNOSIS — Z79899 Other long term (current) drug therapy: Secondary | ICD-10-CM | POA: Insufficient documentation

## 2015-06-04 LAB — CBC WITH DIFFERENTIAL/PLATELET
Basophils Absolute: 0 10*3/uL (ref 0.0–0.1)
Basophils Relative: 0 %
EOS ABS: 0.1 10*3/uL (ref 0.0–0.7)
EOS PCT: 1 %
HCT: 43.5 % (ref 39.0–52.0)
Hemoglobin: 14.7 g/dL (ref 13.0–17.0)
Lymphocytes Relative: 37 %
Lymphs Abs: 2.1 10*3/uL (ref 0.7–4.0)
MCH: 32.5 pg (ref 26.0–34.0)
MCHC: 33.8 g/dL (ref 30.0–36.0)
MCV: 96.2 fL (ref 78.0–100.0)
MONOS PCT: 9 %
Monocytes Absolute: 0.5 10*3/uL (ref 0.1–1.0)
Neutro Abs: 2.9 10*3/uL (ref 1.7–7.7)
Neutrophils Relative %: 53 %
PLATELETS: 245 10*3/uL (ref 150–400)
RBC: 4.52 MIL/uL (ref 4.22–5.81)
RDW: 13.1 % (ref 11.5–15.5)
WBC: 5.7 10*3/uL (ref 4.0–10.5)

## 2015-06-04 LAB — COMPREHENSIVE METABOLIC PANEL
ALBUMIN: 3.8 g/dL (ref 3.5–5.0)
ALK PHOS: 61 U/L (ref 38–126)
ALT: 13 U/L — AB (ref 17–63)
AST: 20 U/L (ref 15–41)
Anion gap: 6 (ref 5–15)
BILIRUBIN TOTAL: 0.6 mg/dL (ref 0.3–1.2)
BUN: 13 mg/dL (ref 6–20)
CALCIUM: 9.3 mg/dL (ref 8.9–10.3)
CO2: 31 mmol/L (ref 22–32)
CREATININE: 1.04 mg/dL (ref 0.61–1.24)
Chloride: 105 mmol/L (ref 101–111)
GFR calc Af Amer: >60 mL/min (ref >60)
GFR calc non Af Amer: >60 mL/min (ref >60)
GLUCOSE: 107 mg/dL — AB (ref 65–99)
Potassium: 4.4 mmol/L (ref 3.5–5.1)
SODIUM: 142 mmol/L (ref 135–145)
TOTAL PROTEIN: 6.7 g/dL (ref 6.5–8.1)

## 2015-06-04 LAB — URINALYSIS, ROUTINE W REFLEX MICROSCOPIC
Bilirubin Urine: NEGATIVE
GLUCOSE, UA: NEGATIVE mg/dL
HGB URINE DIPSTICK: NEGATIVE
KETONES UR: NEGATIVE mg/dL
Leukocytes, UA: NEGATIVE
Nitrite: NEGATIVE
Protein, ur: NEGATIVE mg/dL
Specific Gravity, Urine: 1.018 (ref 1.005–1.030)
Urobilinogen, UA: 1 mg/dL (ref 0.0–1.0)
pH: 6 (ref 5.0–8.0)

## 2015-06-04 LAB — LIPASE, BLOOD: Lipase: 23 U/L (ref 22–51)

## 2015-06-04 MED ORDER — PREDNISONE 20 MG PO TABS: 40.0000 mg | ORAL_TABLET | Freq: Every day | ORAL | Status: DC

## 2015-06-04 NOTE — ED Notes (Signed)
Pt  C/o mid lower abd pain x's 3 days,.  Pt denies any nausea vomiting or diarrhea

## 2015-06-04 NOTE — ED Notes (Signed)
Pt alert x4 respirations easy non labored.  

## 2015-06-04 NOTE — ED Provider Notes (Signed)
CSN: [id number]     Arrival date & time 06/04/15  1055 History  This chart was scribed for non-physician practitioner Debroah Baller, NP working with Blanchie Dessert, MD by Hilda Lias, ED Scribe. This patient was seen in room TR01C/TR01C and the patient's care was started at 3:47 PM.    Chief Complaint  Patient presents with  . Back Pain  . Hip Pain      The history is provided by the patient. No language interpreter was used.   HPI Comments: Lucas Walker is a 54 y.o. male with chronic lower back pain who presents to the Emergency Department complaining of recurrent left lower back and constant hip pain with associated upper abdominal pain and urinary frequency that has been present for a few days. Pt reports a hx of back pain for 3 years he has an appointment with his doctor in the internal medicine clinic for that. He states his concern today is with his pain in his abdomen that started a few days ago and is worsening over the past couple of days. Pt denies any injury to his back and states his abdominal pain had a random onset. Pt states he has never had abdominal pain like this before. The pain is in the upper and lower abdomen.  Pt reports drinking a lot of alcohol and states he consumes at least 3x 40 oz malt liquor drinks per day. Pt denies vomiting or diarrhea.     Past Medical History  Diagnosis Date  . Chronic low back pain   . Tobacco dependence   . ETOH abuse     cociane abuse, tobacco abuse  . Arthritis     neck and left shoulder   History reviewed. No pertinent past surgical history. History reviewed. No pertinent family history. Social History  Substance Use Topics  . Smoking status: Former Smoker    Types: Cigarettes    Quit date: 03/04/2009  . Smokeless tobacco: None  . Alcohol Use: 2.4 oz/week    4 Cans of beer per week     Comment: 3- 40oz per day    Review of Systems All systems negative except as stated in HPI  Allergies  Review of patient's allergies  indicates no known allergies.  Home Medications   Prior to Admission medications   Medication Sig Start Date End Date Taking? Authorizing Provider  cyclobenzaprine (FLEXERIL) 10 MG tablet Take 1 tablet (10 mg total) by mouth 3 (three) times daily as needed for muscle spasms. 07/04/13   Theodis Blaze, MD  famotidine (PEPCID) 20 MG tablet Take 1 tablet (20 mg total) by mouth 2 (two) times daily. 09/12/14   Tatyana Kirichenko, PA-C  meloxicam (MOBIC) 7.5 MG tablet Take 1 tablet (7.5 mg total) by mouth daily as needed for pain. 11/25/14   Clayton Bibles, PA-C  omeprazole (PRILOSEC) 20 MG capsule Take 1 capsule (20 mg total) by mouth daily. 09/12/14   Tatyana Kirichenko, PA-C  oxyCODONE-acetaminophen (ROXICET) 5-325 MG per tablet Take 1 tablet by mouth every 8 (eight) hours as needed for pain. 07/04/13   Theodis Blaze, MD  traMADol (ULTRAM) 50 MG tablet Take 1 tablet (50 mg total) by mouth every 6 (six) hours as needed. 07/14/13   Robbie Lis, MD   BP 109/69 mmHg  Pulse 60  Temp(Src) 98.7 F (37.1 C) (Oral)  Resp 16  Ht 5\' 11"  (1.803 m)  Wt 150 lb (68.04 kg)  BMI 20.93 kg/m2  SpO2 99% Physical Exam  Constitutional: He is oriented to person, place, and time. He appears well-developed and well-nourished.  HENT:  Head: Normocephalic and atraumatic.  Cardiovascular: Normal rate and regular rhythm.   Pulmonary/Chest: Effort normal and breath sounds normal. No respiratory distress. He has no wheezes. He has no rales. He exhibits no tenderness.  Abdominal: He exhibits no distension.  Tenderness to suprapubic, RLQ, LLQ, LUQ, Epigastric regions  Musculoskeletal:  No CVA tenderness Lumbar tenderness to palpation   Neurological: He is alert and oriented to person, place, and time.  Skin: Skin is warm and dry.  Psychiatric: He has a normal mood and affect.  Nursing note and vitals reviewed.   ED Course  Procedures (including critical care time) Labs and medical screening exam   DIAGNOSTIC  STUDIES: Oxygen Saturation is 99% on room air, normal by my interpretation.    COORDINATION OF CARE: 2:10 PM Discussed treatment plan with pt at bedside and pt agreed to plan.   Labs Review Results for orders placed or performed during the hospital encounter of 06/04/15 (from the past 24 hour(s))  Comprehensive metabolic panel     Status: Abnormal   Collection Time: 06/04/15  2:15 PM  Result Value Ref Range   Sodium 142 135 - 145 mmol/L   Potassium 4.4 3.5 - 5.1 mmol/L   Chloride 105 101 - 111 mmol/L   CO2 31 22 - 32 mmol/L   Glucose, Bld 107 (H) 65 - 99 mg/dL   BUN 13 6 - 20 mg/dL   Creatinine, Ser 1.04 0.61 - 1.24 mg/dL   Calcium 9.3 8.9 - 10.3 mg/dL   Total Protein 6.7 6.5 - 8.1 g/dL   Albumin 3.8 3.5 - 5.0 g/dL   AST 20 15 - 41 U/L   ALT 13 (L) 17 - 63 U/L   Alkaline Phosphatase 61 38 - 126 U/L   Total Bilirubin 0.6 0.3 - 1.2 mg/dL   GFR calc non Af Amer >60 >60 mL/min   GFR calc Af Amer >60 >60 mL/min   Anion gap 6 5 - 15  Lipase, blood     Status: None   Collection Time: 06/04/15  2:15 PM  Result Value Ref Range   Lipase 23 22 - 51 U/L  CBC with Differential     Status: None   Collection Time: 06/04/15  2:15 PM  Result Value Ref Range   WBC 5.7 4.0 - 10.5 K/uL   RBC 4.52 4.22 - 5.81 MIL/uL   Hemoglobin 14.7 13.0 - 17.0 g/dL   HCT 43.5 39.0 - 52.0 %   MCV 96.2 78.0 - 100.0 fL   MCH 32.5 26.0 - 34.0 pg   MCHC 33.8 30.0 - 36.0 g/dL   RDW 13.1 11.5 - 15.5 %   Platelets 245 150 - 400 K/uL   Neutrophils Relative % 53 %   Neutro Abs 2.9 1.7 - 7.7 K/uL   Lymphocytes Relative 37 %   Lymphs Abs 2.1 0.7 - 4.0 K/uL   Monocytes Relative 9 %   Monocytes Absolute 0.5 0.1 - 1.0 K/uL   Eosinophils Relative 1 %   Eosinophils Absolute 0.1 0.0 - 0.7 K/uL   Basophils Relative 0 %   Basophils Absolute 0.0 0.0 - 0.1 K/uL     Imaging Review No results found. I have personally reviewed and evaluated these images and lab results as part of my medical decision-making.   MDM   Medical screening exam complete and patient stable to transfer to the main ED for continued  evaluation of his abdominal pain.   Discussed with Dr. Maryan Rued and she will accept patient in [facility].  I personally performed the services described in this documentation, which was scribed in my presence. The recorded information has been reviewed and is accurate.    [address] [place], NP 06/04/15 [place], MD 06/06/15 [phone]

## 2015-06-04 NOTE — Discharge Instructions (Signed)
Hip Pain °Your hip is the joint between your upper legs and your lower pelvis. The bones, cartilage, tendons, and muscles of your hip joint perform a lot of work each day supporting your body weight and allowing you to move around. °Hip pain can range from a minor ache to severe pain in one or both of your hips. Pain may be felt on the inside of the hip joint near the groin, or the outside near the buttocks and upper thigh. You may have swelling or stiffness as well.  °HOME CARE INSTRUCTIONS  °· Take medicines only as directed by your health care provider. °· Apply ice to the injured area: °¨ Put ice in a plastic bag. °¨ Place a towel between your skin and the bag. °¨ Leave the ice on for 15-20 minutes at a time, 3-4 times a day. °· Keep your leg raised (elevated) when possible to lessen swelling. °· Avoid activities that cause pain. °· Follow specific exercises as directed by your health care provider. °· Sleep with a pillow between your legs on your most comfortable side. °· Record how often you have hip pain, the location of the pain, and what it feels like. °SEEK MEDICAL CARE IF:  °· You are unable to put weight on your leg. °· Your hip is red or swollen or very tender to touch. °· Your pain or swelling continues or worsens after 1 week. °· You have increasing difficulty walking. °· You have a fever. °SEEK IMMEDIATE MEDICAL CARE IF:  °· You have fallen. °· You have a sudden increase in pain and swelling in your hip. °MAKE SURE YOU:  °· Understand these instructions. °· Will watch your condition. °· Will get help right away if you are not doing well or get worse. °Document Released: 02/12/2010 Document Revised: 01/09/2014 Document Reviewed: 04/21/2013 °[employer]® Patient Information ©2015 [employer]. This information is not intended to replace advice given to you by your health care provider. Make sure you discuss any questions you have with your health care provider. ° °Musculoskeletal Pain °Musculoskeletal  pain is muscle and boney aches and pains. These pains can occur in any part of the body. Your caregiver may treat you without knowing the cause of the pain. They may treat you if blood or urine tests, X-rays, and other tests were normal.  °CAUSES °There is often not a definite cause or reason for these pains. These pains may be caused by a type of germ (virus). The discomfort may also come from overuse. Overuse includes working out too hard when your body is not fit. Boney aches also come from weather changes. Bone is sensitive to atmospheric pressure changes. °HOME CARE INSTRUCTIONS  °· Ask when your test results will be ready. Make sure you get your test results. °· Only take over-the-counter or prescription medicines for pain, discomfort, or fever as directed by your caregiver. If you were given medications for your condition, do not drive, operate machinery or power tools, or sign legal documents for 24 hours. Do not drink alcohol. Do not take sleeping pills or other medications that may interfere with treatment. °· Continue all activities unless the activities cause more pain. When the pain lessens, slowly resume normal activities. Gradually increase the intensity and duration of the activities or exercise. °· During periods of severe pain, bed rest may be helpful. Lay or sit in any position that is comfortable. °· Putting ice on the injured area. °¨ Put ice in a bag. °¨ Place a towel   between your skin and the bag. °¨ Leave the ice on for 15 to 20 minutes, 3 to 4 times a day. °· Follow up with your caregiver for continued problems and no reason can be found for the pain. If the pain becomes worse or does not go away, it may be necessary to repeat tests or do additional testing. Your caregiver may need to look further for a possible cause. °SEEK IMMEDIATE MEDICAL CARE IF: °· You have pain that is getting worse and is not relieved by medications. °· You develop chest pain that is associated with shortness or  breath, sweating, feeling sick to your stomach (nauseous), or throw up (vomit). °· Your pain becomes localized to the abdomen. °· You develop any new symptoms that seem different or that concern you. °MAKE SURE YOU:  °· Understand these instructions. °· Will watch your condition. °· Will get help right away if you are not doing well or get worse. °Document Released: 08/25/2005 Document Revised: 11/17/2011 Document Reviewed: 04/29/2013 °Exit[employer]® Patient Information ©2015 [employer]. This information is not intended to replace advice given to you by your health care provider. Make sure you discuss any questions you have with your health care provider. ° °

## 2015-06-04 NOTE — ED Notes (Signed)
Pt reports lower back pain and left hip for the past couple of days. Denies any injury, reports the pain has been there for 3 years.

## 2015-06-04 NOTE — ED Notes (Signed)
PER information Patient gave [facility] PA information he drinks 3 -40 oz beers a day. He also told PA he has epigastric pain and urinary frequency. The back and hip pain info given in  Triage is a chronic problem.

## 2015-06-04 NOTE — ED Notes (Signed)
PT returned from x-ray, admitting MD at bedside

## 2015-06-04 NOTE — ED Notes (Signed)
Pt to xray at this time.

## 2015-06-05 ENCOUNTER — Encounter: Payer: Self-pay | Admitting: Internal Medicine

## 2015-06-05 ENCOUNTER — Ambulatory Visit (INDEPENDENT_AMBULATORY_CARE_PROVIDER_SITE_OTHER): Payer: Medicaid Other | Admitting: Internal Medicine

## 2015-06-05 VITALS — BP 109/67 | HR 60 | Temp 98.2°F | Ht 71.0 in | Wt 131.7 lb

## 2015-06-05 DIAGNOSIS — M545 Low back pain: Secondary | ICD-10-CM | POA: Diagnosis not present

## 2015-06-05 DIAGNOSIS — M1990 Unspecified osteoarthritis, unspecified site: Secondary | ICD-10-CM

## 2015-06-05 DIAGNOSIS — M25552 Pain in left hip: Secondary | ICD-10-CM | POA: Diagnosis not present

## 2015-06-05 DIAGNOSIS — R1032 Left lower quadrant pain: Secondary | ICD-10-CM | POA: Diagnosis present

## 2015-06-05 DIAGNOSIS — Z0000 Encounter for general adult medical examination without abnormal findings: Secondary | ICD-10-CM

## 2015-06-05 DIAGNOSIS — M542 Cervicalgia: Secondary | ICD-10-CM

## 2015-06-05 MED ORDER — ACETAMINOPHEN 325 MG PO TABS: 650.0000 mg | ORAL_TABLET | ORAL | Status: AC | PRN

## 2015-06-05 MED ORDER — IBUPROFEN 800 MG PO TABS: 800.0000 mg | ORAL_TABLET | Freq: Three times a day (TID) | ORAL | Status: DC | PRN

## 2015-06-05 NOTE — Patient Instructions (Signed)
For your hip pain, please continue with the short course of steroids prescribed by the ER physician.  For your arthritis, I have sent a prescription for both Ibuprofen and Tylenol to your pharmacy. Please alternate between Ibuprofen and Tylenol every four hours only as needed for pain, taking the Ibuprofen up to 3 times a day and the Tylenol up to 4 times per day.

## 2015-06-05 NOTE — Progress Notes (Signed)
Patient ID: Lucas Walker, male   DOB: December 23, 1960, 54 y.o.   MRN: [id number]   Subjective:   Patient ID: Lucas Walker male   DOB: 05/20/1961 53 y.o.   MRN: [id number]  HPI: Lucas Walker is a 54 y.o. male with PMH as listed below who presents as a new patient to establish care. He was also seen yesterday in the ED for lower abdominal pain.  Please see problem list for status of chronic medical conditions.  Past Medical History  Diagnosis Date  . Chronic low back pain   . Tobacco dependence   . ETOH abuse     cociane abuse, tobacco abuse  . Arthritis     neck and left shoulder   Current Outpatient Prescriptions  Medication Sig Dispense Refill  . acetaminophen (TYLENOL) 325 MG tablet Take 2 tablets (650 mg total) by mouth every 4 (four) hours as needed. 100 tablet 2  . ibuprofen (ADVIL,MOTRIN) 800 MG tablet Take 1 tablet (800 mg total) by mouth every 8 (eight) hours as needed. 90 tablet 0  . predniSONE (DELTASONE) 20 MG tablet Take 2 tablets (40 mg total) by mouth daily. 6 tablet 0   No current facility-administered medications for this visit.   No family history on file. Social History   Social History  . Marital Status: Single    Spouse Name: N/A  . Number of Children: N/A  . Years of Education: N/A   Social History Main Topics  . Smoking status: Current Every Day Smoker    Types: Cigarettes    Last Attempt to Quit: 03/04/2009  . Smokeless tobacco: Not on file     Comment: 1/2 PPD  . Alcohol Use: 2.4 oz/week    4 Cans of beer per week     Comment: 3- 40oz per day  . Drug Use: Yes     Comment: denies cociane use presently but UDS positive  . Sexual Activity: Not on file   Other Topics Concern  . Not on file   Social History Narrative   Review of Systems: Review of Systems  Constitutional: Negative for fever and chills.  Eyes: Negative for blurred vision.  Respiratory: Negative for cough, sputum production, shortness of breath and wheezing.   Cardiovascular:  Negative for chest pain, palpitations, claudication and leg swelling.  Gastrointestinal: Positive for abdominal pain. Negative for heartburn, nausea, vomiting, diarrhea, constipation, blood in stool and melena.  Genitourinary: Negative for dysuria, urgency and frequency.  Musculoskeletal: Positive for back pain, joint pain and neck pain. Negative for myalgias and falls.  Skin: Negative for rash.  Neurological: Negative for dizziness, tingling, sensory change, focal weakness, weakness and headaches.    Objective:  Physical Exam: Filed Vitals:   06/05/15 1058  BP: 109/67  Pulse: 60  Temp: 98.2 F (36.8 C)  TempSrc: Oral  Height: 5\' 11"  (1.803 m)  Weight: 131 lb 11.2 oz (59.739 kg)  SpO2: 100%   Physical Exam  Constitutional: He is oriented to person, place, and time. He appears well-developed and well-nourished. No distress.  HENT:  Head: Normocephalic and atraumatic.  Eyes: EOM are normal.  Cardiovascular: Normal rate, regular rhythm and intact distal pulses.   No murmur heard. Pulmonary/Chest: Effort normal and breath sounds normal. No respiratory distress. He has no wheezes.  Abdominal: Soft. Bowel sounds are normal. He exhibits no distension. There is no guarding. No hernia.  Tender to palpation at the mid-lower abdominal region to the left groin area.  Musculoskeletal: He exhibits  no edema.       Left hip: He exhibits no swelling.       Thoracic back: He exhibits bony tenderness.  Decreased ROM of left hip. Pain elicited with left hip flexion and external rotation.  Neurological: He is alert and oriented to person, place, and time.  Skin: Skin is warm.  Psychiatric: He has a normal mood and affect.    Assessment & Plan:  Please see problem based charting for assessment and plan.

## 2015-06-05 NOTE — Assessment & Plan Note (Addendum)
Patient presents with complaint of 4 days history of left groin pain. He states the pain is located from the lower abdominal area below the umbilicus to the left groin, without involvement of the testes. The pain is constant, 5/10 in intensity, and worse when walking. He denies any alleviating factors or any noticeable lumps, bumps, or rashes in the area. He was seen in the ED yesterday for the same complaints and workup labs of CBC, lipase, CMP, and UA were all unremarkable. 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 He was prescribed short course of prednisone. On physical exam, he has tenderness in the lower abdominal area and left groin. Exam for hernia is negative. ROM of left hip is decreased with pain elicited on hip flexion and external rotation. His groin pain is likely from his hip arthropathy. -Agree with plan to continue short course Prednisone 40 mg BID for 3 days

## 2015-06-05 NOTE — Assessment & Plan Note (Signed)
Patient declined flu shot and colonoscopy today. Agreed to stool cards for colorectal cancer screening. -FOBT stool cards given to patient and educated on proper use

## 2015-06-05 NOTE — Assessment & Plan Note (Signed)
Patient with chronic arthritis with complaints of pain in the lower back, neck, and left hip. CT cervical spine from 05/2013 shows multilevel degenerative disc disease most severe at C5-6 and C6-7. He has tenderness to palpation of spinous processes at mid-thoracic level. He states he takes Ibuprofen for the pain, up to 9 pills a day. He is unsure of the dose. He has taken Meloxicam and Flexeril as well in the past. -Will start alternating regimen of tylenol and ibuprofen every 4 hours prn for pain. Tylenol 650 mg up to 4 times a day and Ibuprofen 800 mg up to three times a day. Patient understands and agrees to alternating regimen.

## 2015-06-15 NOTE — Progress Notes (Signed)
Internal Medicine Clinic Attending  I saw and evaluated the patient.  I personally confirmed the key portions of the history and exam documented by Dr. Patel,Vishal and I reviewed pertinent patient test results.  The assessment, diagnosis, and plan were formulated together and I agree with the documentation in the resident's note.  

## 2015-07-12 ENCOUNTER — Other Ambulatory Visit (INDEPENDENT_AMBULATORY_CARE_PROVIDER_SITE_OTHER): Payer: Medicaid Other

## 2015-07-12 DIAGNOSIS — Z0000 Encounter for general adult medical examination without abnormal findings: Secondary | ICD-10-CM

## 2015-07-12 DIAGNOSIS — Z1211 Encounter for screening for malignant neoplasm of colon: Secondary | ICD-10-CM

## 2015-07-12 LAB — POC HEMOCCULT BLD/STL (HOME/3-CARD/SCREEN)
Card #2 Fecal Occult Blod, POC: NEGATIVE
Card #3 Fecal Occult Blood, POC: NEGATIVE
FECAL OCCULT BLD: NEGATIVE

## 2016-03-11 ENCOUNTER — Emergency Department (HOSPITAL_COMMUNITY)
Admission: EM | Admit: 2016-03-11 | Discharge: 2016-03-11 | Disposition: A | Discharge status: Home / Self Care | Payer: Medicaid Other | Attending: Emergency Medicine | Admitting: Emergency Medicine

## 2016-03-11 ENCOUNTER — Emergency Department (HOSPITAL_COMMUNITY): Payer: Medicaid Other

## 2016-03-11 ENCOUNTER — Encounter (HOSPITAL_COMMUNITY): Payer: Self-pay | Admitting: Emergency Medicine

## 2016-03-11 DIAGNOSIS — F17210 Nicotine dependence, cigarettes, uncomplicated: Secondary | ICD-10-CM | POA: Insufficient documentation

## 2016-03-11 DIAGNOSIS — F410 Panic disorder [episodic paroxysmal anxiety] without agoraphobia: Secondary | ICD-10-CM | POA: Diagnosis not present

## 2016-03-11 DIAGNOSIS — F419 Anxiety disorder, unspecified: Secondary | ICD-10-CM | POA: Diagnosis present

## 2016-03-11 DIAGNOSIS — Z79899 Other long term (current) drug therapy: Secondary | ICD-10-CM | POA: Insufficient documentation

## 2016-03-11 LAB — I-STAT TROPONIN, ED
TROPONIN I, POC: 0 ng/mL (ref 0.00–0.08)
Troponin i, poc: 0 ng/mL (ref 0.00–0.08)

## 2016-03-11 LAB — BASIC METABOLIC PANEL
Anion gap: 7 (ref 5–15)
BUN: 14 mg/dL (ref 6–20)
CHLORIDE: 106 mmol/L (ref 101–111)
CO2: 24 mmol/L (ref 22–32)
CREATININE: 0.99 mg/dL (ref 0.61–1.24)
Calcium: 8.9 mg/dL (ref 8.9–10.3)
GFR calc Af Amer: >60 mL/min (ref >60)
GFR calc non Af Amer: >60 mL/min (ref >60)
GLUCOSE: 90 mg/dL (ref 65–99)
POTASSIUM: 3.6 mmol/L (ref 3.5–5.1)
Sodium: 137 mmol/L (ref 135–145)

## 2016-03-11 LAB — CBC WITH DIFFERENTIAL/PLATELET
Basophils Absolute: 0 10*3/uL (ref 0.0–0.1)
Basophils Relative: 0 %
EOS PCT: 0 %
Eosinophils Absolute: 0 10*3/uL (ref 0.0–0.7)
HCT: 41.2 % (ref 39.0–52.0)
Hemoglobin: 13.9 g/dL (ref 13.0–17.0)
LYMPHS ABS: 1.5 10*3/uL (ref 0.7–4.0)
LYMPHS PCT: 22 %
MCH: 32.1 pg (ref 26.0–34.0)
MCHC: 33.7 g/dL (ref 30.0–36.0)
MCV: 95.2 fL (ref 78.0–100.0)
MONO ABS: 0.4 10*3/uL (ref 0.1–1.0)
MONOS PCT: 6 %
Neutro Abs: 4.9 10*3/uL (ref 1.7–7.7)
Neutrophils Relative %: 72 %
Platelets: 231 10*3/uL (ref 150–400)
RBC: 4.33 MIL/uL (ref 4.22–5.81)
RDW: 12.7 % (ref 11.5–15.5)
WBC: 6.8 10*3/uL (ref 4.0–10.5)

## 2016-03-11 MED ORDER — ASPIRIN 81 MG PO CHEW
324.0000 mg | CHEWABLE_TABLET | Freq: Once | ORAL | Status: AC
  Administered 2016-03-11: 324 mg via ORAL
  Filled 2016-03-11: qty 4

## 2016-03-11 NOTE — ED Notes (Signed)
Per [facility], pt having argument with girlfriend, started getting difficutly breathing and lightheadedness. Called ems. Denies CP. No cardiac hx. Respirations equal and unlabored. AAox4, lung sounds clear.

## 2016-03-11 NOTE — ED Provider Notes (Signed)
CSN: [id number]     Arrival date & time 03/11/16  1237 History   First MD Initiated Contact with Patient 03/11/16 1240     Chief Complaint  Patient presents with  . Anxiety     (Consider location/radiation/quality/duration/timing/severity/associated sxs/prior Treatment) Patient is a 55 y.o. male presenting with anxiety. The history is provided by the patient.  Anxiety This is a new problem. The current episode started less than 1 hour ago. The problem occurs constantly. The problem has been resolved. Associated symptoms include chest pain and shortness of breath. Pertinent negatives include no abdominal pain and no headaches. Nothing aggravates the symptoms. Nothing relieves the symptoms. He has tried nothing for the symptoms. The treatment provided no relief.   55 yo M With a chief complaint of a panic attack. Patient was in an argument with his significant other when he suddenly had shortness of breath and chest discomfort. He felt like he may pass out and started feeling like his hands and feet hurt. Denies prior episode. Smokes cigarettes but denies any other risk factors for MI. Denies PE risk factors.  Past Medical History  Diagnosis Date  . Chronic low back pain   . Tobacco dependence   . ETOH abuse     cociane abuse, tobacco abuse  . Arthritis     neck and left shoulder   History reviewed. No pertinent past surgical history. No family history on file. Social History  Substance Use Topics  . Smoking status: Current Every Day Smoker    Types: Cigarettes    Last Attempt to Quit: 03/04/2009  . Smokeless tobacco: None     Comment: 1/2 PPD  . Alcohol Use: 2.4 oz/week    4 Cans of beer per week     Comment: 3- 40oz per day    Review of Systems  Constitutional: Negative for fever and chills.  HENT: Negative for congestion and facial swelling.   Eyes: Negative for discharge and visual disturbance.  Respiratory: Positive for shortness of breath.   Cardiovascular: Positive for  chest pain. Negative for palpitations.  Gastrointestinal: Negative for vomiting, abdominal pain and diarrhea.  Musculoskeletal: Negative for myalgias and arthralgias.  Skin: Negative for color change and rash.  Neurological: Negative for tremors, syncope and headaches.  Psychiatric/Behavioral: Negative for confusion and dysphoric mood.      Allergies  Review of patient's allergies indicates no known allergies.  Home Medications   Prior to Admission medications   Medication Sig Start Date End Date Taking? Authorizing Provider  acetaminophen (TYLENOL) 325 MG tablet Take 2 tablets (650 mg total) by mouth every 4 (four) hours as needed. Patient not taking: Reported on 03/11/2016 06/05/15 06/04/16  Zada Finders, MD  ibuprofen (ADVIL,MOTRIN) 800 MG tablet Take 1 tablet (800 mg total) by mouth every 8 (eight) hours as needed. Patient not taking: Reported on 03/11/2016 06/05/15   Zada Finders, MD   BP 119/76 mmHg  Pulse 57  Temp(Src) 97.5 F (36.4 C) (Oral)  Resp 18  SpO2 97% Physical Exam  Constitutional: He is oriented to person, place, and time. He appears well-developed and well-nourished.  HENT:  Head: Normocephalic and atraumatic.  Eyes: EOM are normal. Pupils are equal, round, and reactive to light.  Neck: Normal range of motion. Neck supple. No JVD present.  Cardiovascular: Normal rate and regular rhythm.  Exam reveals no gallop and no friction rub.   No murmur heard. Pulmonary/Chest: No respiratory distress. He has no wheezes.  Abdominal: He exhibits no distension. There  is no tenderness. There is no rebound and no guarding.  Musculoskeletal: Normal range of motion.  Neurological: He is alert and oriented to person, place, and time.  Skin: No rash noted. No pallor.  Psychiatric: He has a normal mood and affect. His behavior is normal.  Nursing note and vitals reviewed.   ED Course  Procedures (including critical care time) Labs Review Labs Reviewed  CBC WITH  DIFFERENTIAL/PLATELET  BASIC METABOLIC PANEL  I-STAT TROPOININ, ED  Randolm Idol, ED    Imaging Review Dg Chest 2 View  03/11/2016  CLINICAL DATA:  55 year old male shortness of breath EXAM: CHEST  2 VIEW COMPARISON:  None. FINDINGS: The heart size and mediastinal contours are within normal limits. Both lungs are clear. The visualized skeletal structures are unremarkable. IMPRESSION: No radiographic evidence of acute cardiopulmonary disease. Signed, Dulcy Fanny. Earleen Newport, DO [facility] Electronically Signed   By: Corrie Mckusick D.O.   On: 03/11/2016 13:33   I have personally reviewed and evaluated these images and lab results as part of my medical decision-making.   EKG Interpretation   Date/Time:  Tuesday March 11 2016 12:47:09 EDT Ventricular Rate:  75 PR Interval:    QRS Duration: 108 QT Interval:  405 QTC Calculation: 453 R Axis:   1 Text Interpretation:  Sinus rhythm Probable anteroseptal infarct, recent  No significant change since last tracing Confirmed by Theola Cuellar MD, Quillian Quince  [id number]) on 03/11/2016 12:48:53 PM Also confirmed by Tyrone Nine MD, Quillian Quince  [phone]), editor [place], CCT, [place] [id number])  on 03/11/2016 4:18:57 PM      Discussed smoking cessation with patient and was they were offerred resources to help stop.  Total time was 5 min CPT code 99406.    MDM   Final diagnoses:  Panic attack    55 yo M with a chief complaint of shortness breath and chest pain. This happened acutely when the patient was stressed. Feel this is likely a panic attack however patient is a poor historian and is difficult to get him to explain the exact symptoms that he had. Due to this I will obtain a delta troponin.  Discussed with Dr. Alfonse Spruce, awaiting second trop.  The patients results and plan were reviewed and discussed.   Any x-rays performed were independently reviewed by myself.   Differential diagnosis were considered with the presenting  HPI.  Medications  aspirin chewable tablet 324 mg (324 mg Oral Given 03/11/16 1317)    Filed Vitals:   03/11/16 1545 03/11/16 1600 03/11/16 1615 03/11/16 1645  BP: 115/80 107/79 112/67 119/76  Pulse: 92 50 61 57  Temp:      TempSrc:      Resp: 18 14 16 18   SpO2: 90% 97% 97% 97%    Final diagnoses:  Panic attack       Deno Etienne, DO 03/12/16 [id number]

## 2016-03-11 NOTE — Discharge Instructions (Signed)

## 2016-03-11 NOTE — ED Provider Notes (Signed)
Case excepted in sign out pending repeat troponin. Repeat troponin normal. Patient was anxious for discharge. Per plan with Dr. Tyrone Nine patient discharged in stable condition.  Harvel Quale, MD 03/11/16 [phone]

## 2016-10-01 IMAGING — US US ABDOMEN COMPLETE
1 series · 14 of 25 positions shown · non-contrast
Comparison: None.

CLINICAL DATA: Midline abdominal pain

EXAM:
ULTRASOUND ABDOMEN COMPLETE

[Series 1: us abdomen complete · 0.17mm/px · 14 of 87 slices shown]
[im 1/87]
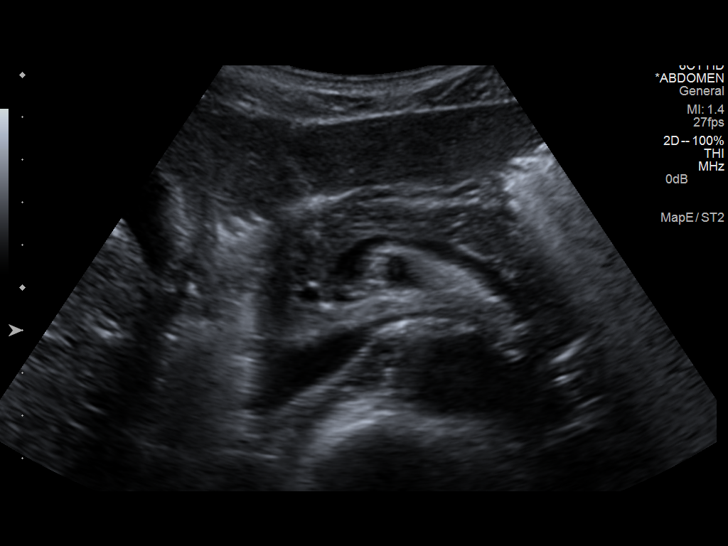
[im 8/87]
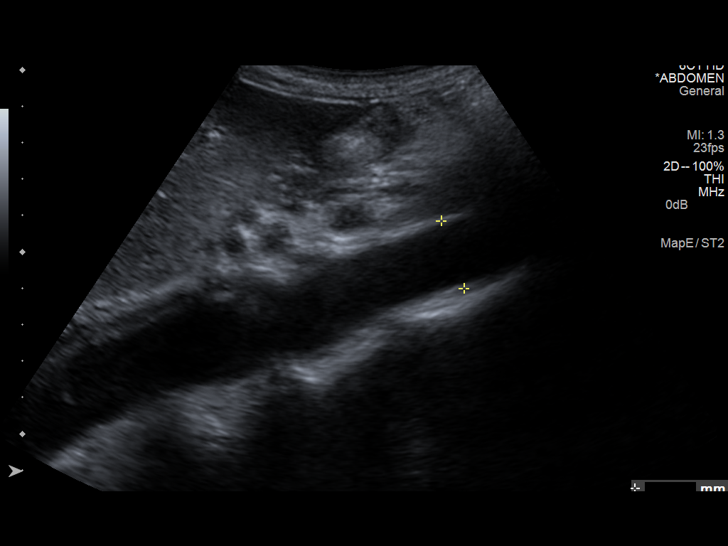
[im 15/87]
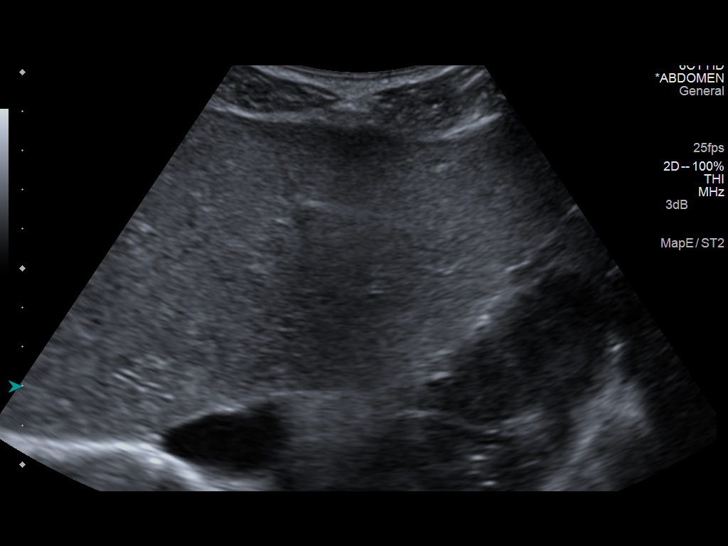
[im 22/87]
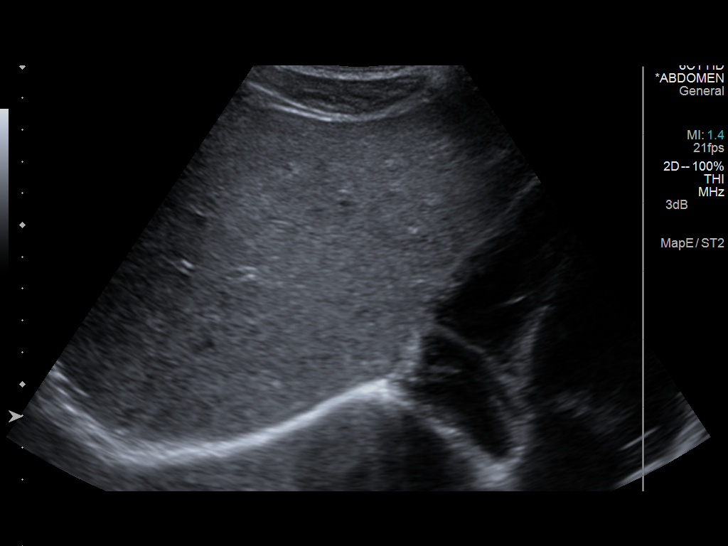
[im 29/87]
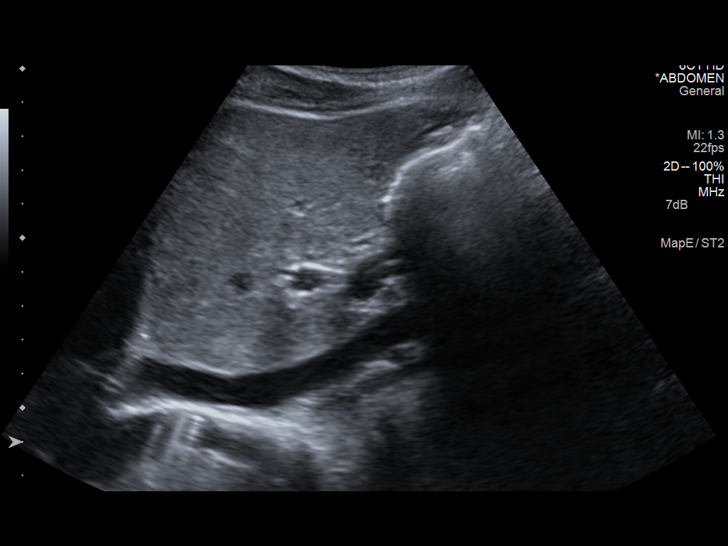
[im 33/87]
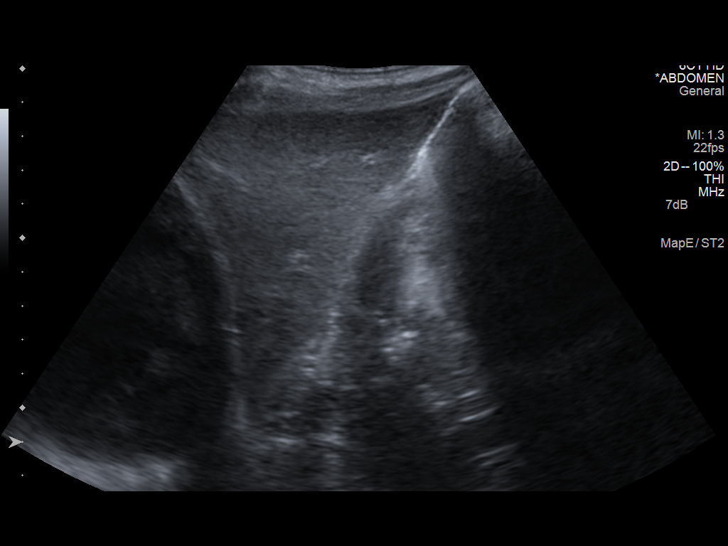
[im 40/87]
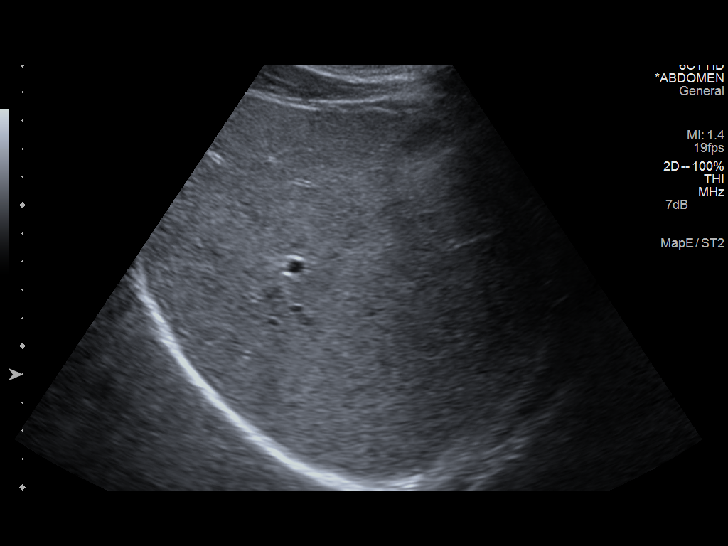
[im 47/87]
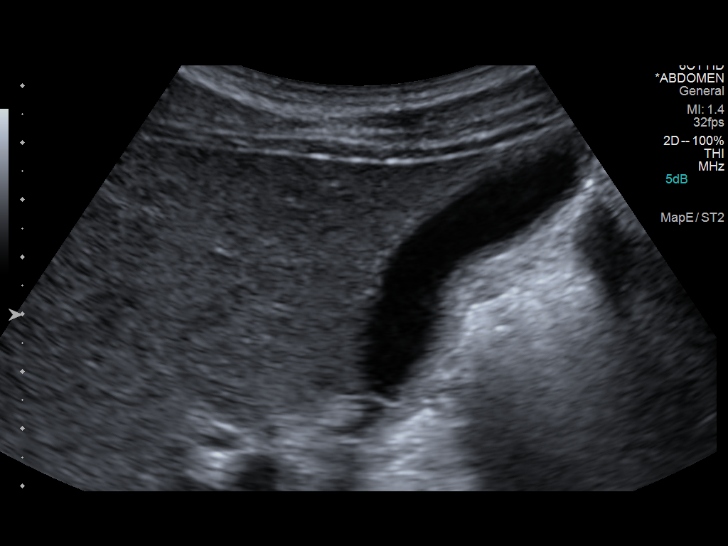
[im 54/87]
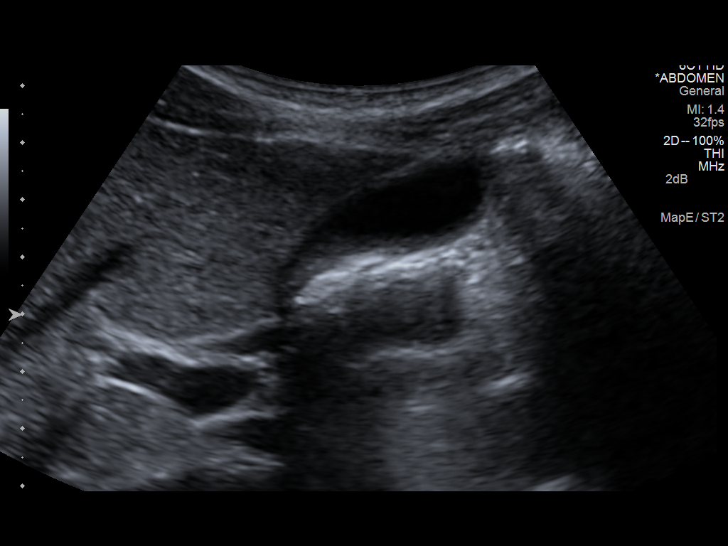
[im 58/87]
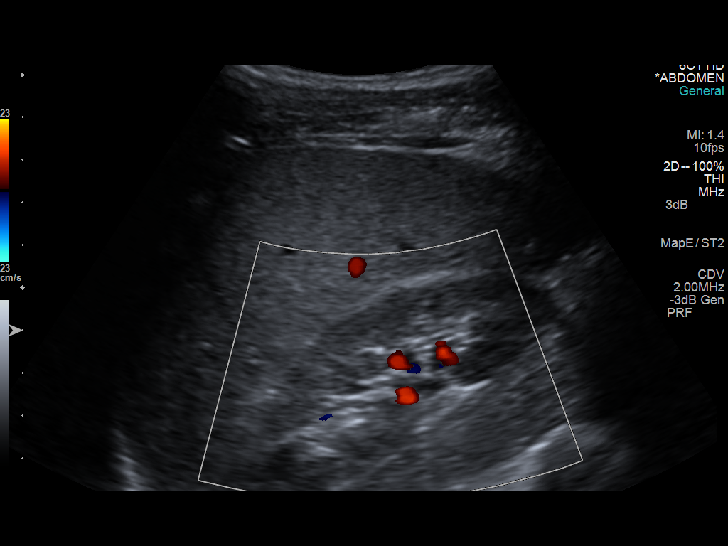
[im 65/87]
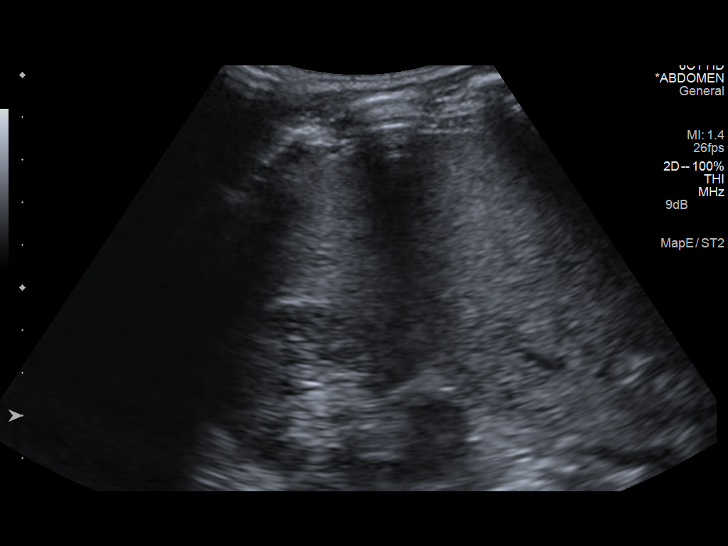
[im 72/87]
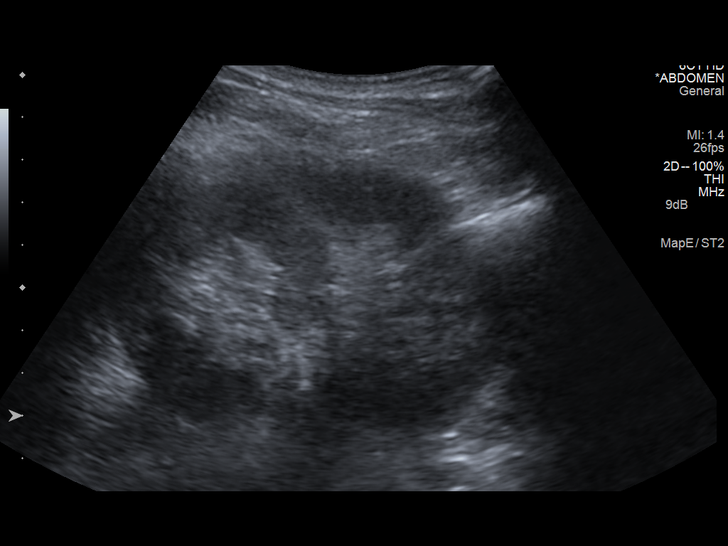
[im 79/87]
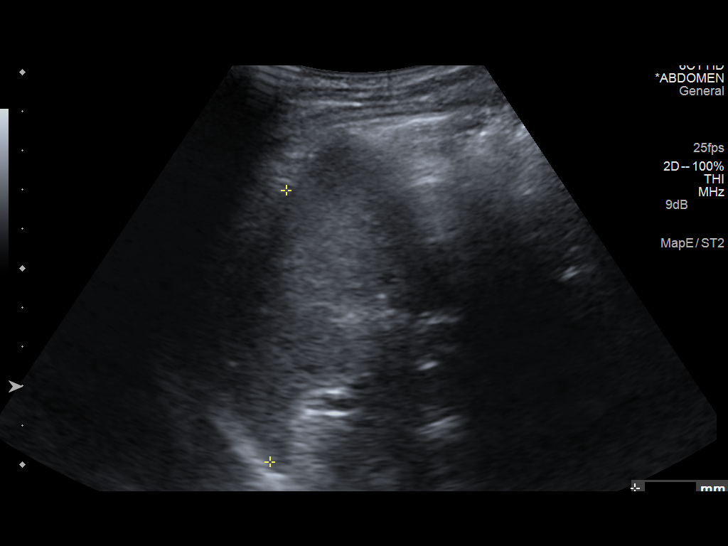
[im 87/87]
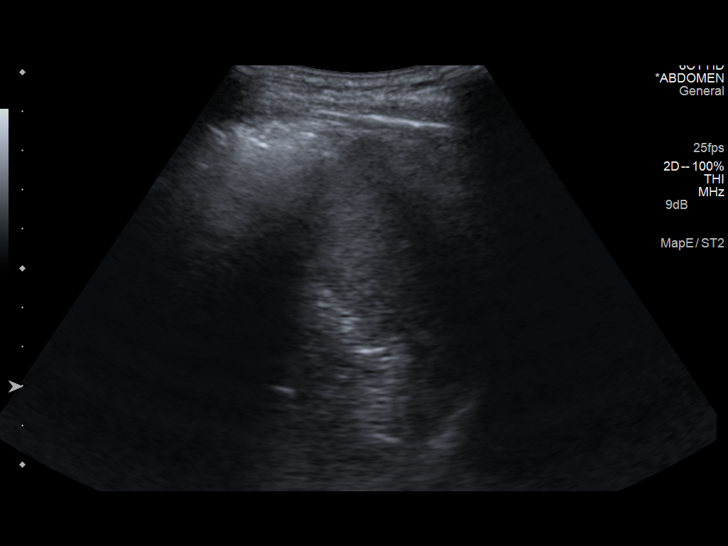

[14 of 25 positions shown; findings below may reference images not displayed]

FINDINGS: Gallbladder: No gallstones or wall thickening visualized. No
sonographic Murphy sign noted.

Common bile duct: Diameter: 5.9 mm.

Liver: No focal lesion identified. Within normal limits in
parenchymal echogenicity.

IVC: No abnormality visualized.

Pancreas: Visualized portion unremarkable.

Spleen: Size and appearance within normal limits.

Right Kidney: Length: 9.6 cm.. Echogenicity within normal limits. No
mass or hydronephrosis visualized.

Left Kidney: Length: 9.7 cm.. Echogenicity within normal limits. No
mass or hydronephrosis visualized.

Abdominal aorta: No aneurysm visualized.

Other findings: None.
IMPRESSION: No acute abnormality noted.

## 2016-10-20 ENCOUNTER — Encounter (HOSPITAL_COMMUNITY): Payer: Self-pay | Admitting: Emergency Medicine

## 2016-10-20 ENCOUNTER — Emergency Department (HOSPITAL_COMMUNITY)
Admission: EM | Admit: 2016-10-20 | Discharge: 2016-10-20 | Disposition: A | Discharge status: Home / Self Care | Payer: Medicaid Other | Attending: Emergency Medicine | Admitting: Emergency Medicine

## 2016-10-20 DIAGNOSIS — M546 Pain in thoracic spine: Secondary | ICD-10-CM | POA: Diagnosis not present

## 2016-10-20 DIAGNOSIS — F17210 Nicotine dependence, cigarettes, uncomplicated: Secondary | ICD-10-CM | POA: Diagnosis not present

## 2016-10-20 DIAGNOSIS — M542 Cervicalgia: Secondary | ICD-10-CM | POA: Diagnosis present

## 2016-10-20 DIAGNOSIS — Z5321 Procedure and treatment not carried out due to patient leaving prior to being seen by health care provider: Secondary | ICD-10-CM | POA: Diagnosis not present

## 2016-10-20 NOTE — ED Notes (Signed)
Patient did not answer when called for in the lobby.

## 2016-10-20 NOTE — ED Triage Notes (Signed)
Pt sts neck and upper back pain worse with movement and some dizziness

## 2016-12-09 ENCOUNTER — Encounter (HOSPITAL_COMMUNITY): Payer: Self-pay | Admitting: Emergency Medicine

## 2016-12-09 ENCOUNTER — Emergency Department (HOSPITAL_COMMUNITY)
Admission: EM | Admit: 2016-12-09 | Discharge: 2016-12-09 | Disposition: A | Discharge status: Home / Self Care | Payer: Medicaid Other | Attending: Emergency Medicine | Admitting: Emergency Medicine

## 2016-12-09 ENCOUNTER — Emergency Department (HOSPITAL_COMMUNITY): Payer: Medicaid Other

## 2016-12-09 DIAGNOSIS — M79621 Pain in right upper arm: Secondary | ICD-10-CM | POA: Insufficient documentation

## 2016-12-09 DIAGNOSIS — M542 Cervicalgia: Secondary | ICD-10-CM | POA: Diagnosis present

## 2016-12-09 DIAGNOSIS — F17210 Nicotine dependence, cigarettes, uncomplicated: Secondary | ICD-10-CM | POA: Insufficient documentation

## 2016-12-09 DIAGNOSIS — G8929 Other chronic pain: Secondary | ICD-10-CM | POA: Diagnosis not present

## 2016-12-09 DIAGNOSIS — M5030 Other cervical disc degeneration, unspecified cervical region: Secondary | ICD-10-CM | POA: Diagnosis not present

## 2016-12-09 DIAGNOSIS — R52 Pain, unspecified: Secondary | ICD-10-CM

## 2016-12-09 DIAGNOSIS — Z79899 Other long term (current) drug therapy: Secondary | ICD-10-CM | POA: Diagnosis not present

## 2016-12-09 DIAGNOSIS — M79601 Pain in right arm: Secondary | ICD-10-CM

## 2016-12-09 MED ORDER — PREDNISONE 10 MG (21) PO TBPK: ORAL_TABLET | Freq: Every day | ORAL | 0 refills | Status: DC

## 2016-12-09 MED ORDER — MELOXICAM 7.5 MG PO TABS: 7.5000 mg | ORAL_TABLET | Freq: Every day | ORAL | 0 refills | Status: DC

## 2016-12-09 MED ORDER — IBUPROFEN 200 MG PO TABS
600.0000 mg | ORAL_TABLET | Freq: Once | ORAL | Status: AC
  Administered 2016-12-09: 600 mg via ORAL
  Filled 2016-12-09: qty 3

## 2016-12-09 MED ORDER — METHOCARBAMOL 500 MG PO TABS: 500.0000 mg | ORAL_TABLET | Freq: Four times a day (QID) | ORAL | 0 refills | Status: DC | PRN

## 2016-12-09 NOTE — ED Triage Notes (Signed)
Pt reports that he called 911 with c/o recurrent neck and back pain. Took advil x 1, 2 days ago., no relief of pain

## 2016-12-09 NOTE — ED Triage Notes (Signed)
Per EMS. Pt has had R arm pain for the past 2 days. Pain worst in shoulder and wrist. No deformities, strong pulse. Pain worsened with movement. Has had issues with pain in this arm; hx of arthritis. Tried advil with no relief.

## 2016-12-09 NOTE — ED Provider Notes (Signed)
[place] DEPT Provider Note   CSN: [id number] Arrival date & time: 12/09/16  1003  By signing my name below, I, Reola Mosher, attest that this documentation has been prepared under the direction and in the presence of [facility], PA-C.  Electronically Signed: Reola Mosher, ED Scribe. 12/09/16. 10:37 AM.  History   Chief Complaint Chief Complaint  Patient presents with  . Shoulder Pain  . Back Pain  . Arm Pain   The history is provided by the patient. No language interpreter was used.    HPI Comments: AKIEM URIETA is a right-hand dominant 56 y.o. male BIB EMS, with a PMHx of chronic low back pain, arthritis, and polysubstance abuse, who presents to the Emergency Department complaining of recurrent right-sided neck which extends into his right shoulder and downward posteriorly into his right forearm beginning two days ago, worsening this morning prior to arrival. He describes his pain as sharp/shooting, and notes that his pain has been constant since onset. No recent trauma or injury to the area. His pain is exacerbated with movement of his right shoulder and arm. Pt reports that he took one dose of Advil two days ago without relief of his pain. He has a h/o similar pain which is recurrent every few months which he relates to his arthritis; however, he states that this episode of pain is the worst he has every experienced. He has previously been followed by the [facility] practice for this issue for which they prescribed for him "arthritis medications", but he has not followed up recently. Additionally, he states that he has had several episodes of nocturnal hyperhidrosis recently.  He denies fever, cough, congestion, sore throat, paraesthesias, or any other associated symptoms.   Past Medical History:  Diagnosis Date  . Arthritis    neck and left shoulder  . Chronic low back pain   . ETOH abuse    cociane abuse, tobacco abuse  . Tobacco dependence     Patient Active Problem List   Diagnosis Date Noted  . Left groin pain 06/05/2015  . Arthritis 06/05/2015  . Healthcare maintenance 06/05/2015  . SUBSTANCE ABUSE, MULTIPLE 10/11/2009  . LIPOMA 10/10/2009  . NICOTINE ADDICTION 10/10/2009  . NECK PAIN 10/10/2009  . BACK PAIN, CHRONIC 07/11/2008   History reviewed. No pertinent surgical history.  Home Medications    Prior to Admission medications   Medication Sig Start Date End Date Taking? Authorizing Provider  meloxicam (MOBIC) 7.5 MG tablet Take 1 tablet (7.5 mg total) by mouth daily. 12/09/16   Clayton Bibles, PA-C  methocarbamol (ROBAXIN) 500 MG tablet Take 1-2 tablets (500-1,000 mg total) by mouth every 6 (six) hours as needed for muscle spasms (and pain). 12/09/16   Clayton Bibles, PA-C  predniSONE (STERAPRED UNI-PAK 21 TAB) 10 MG (21) TBPK tablet Take by mouth daily. Day 1: take 6 tabs.  Day 2: 5 tabs  Day 3: 4 tabs  Day 4: 3 tabs  Day 5: 2 tabs  Day 6: 1 tab 12/09/16   Clayton Bibles, PA-C   Family History Family History  Problem Relation Age of Onset  . Osteoarthritis Mother     Social History Social History  Substance Use Topics  . Smoking status: Current Every Day Smoker    Packs/day: 0.50    Types: Cigarettes    Last attempt to quit: 03/04/2009  . Smokeless tobacco: Never Used     Comment: 1/2 PPD  . Alcohol use 2.4 oz/week    4  Cans of beer per week     Comment: 3- 40oz per day   Allergies   Patient has no known allergies.  Review of Systems Review of Systems  Constitutional: Positive for diaphoresis ( +nocturnal hyperhidrosis). Negative for fever.  HENT: Negative for congestion and sore throat.   Respiratory: Negative for cough.   Musculoskeletal: Positive for arthralgias, myalgias and neck pain.  Neurological:       Negative for paraesthesias.    Physical Exam Updated Vital Signs BP (!) 130/98 (BP Location: Left Arm)   Pulse (!) 59   Temp 99 F (37.2 C) (Oral)   Resp 12   Ht 6' (1.829 m)   Wt 68 kg   SpO2 99%    BMI 20.34 kg/m   Physical Exam  Constitutional: He appears well-developed and well-nourished.  HENT:  Head: Normocephalic and atraumatic.  Neck: Neck supple.  Pulmonary/Chest: Effort normal.  Musculoskeletal:       Back:       Arms: Tenderness to palpation over the thoracic spine.  No focal tenderness of c-spine.  No crepitus or stepoffs.  Right AC joint is tender.  Large soft mass c/w lipoma overlying posterior right shoulder.  Right radial pulse is intact though there is overlying mild bruising and tenderness.  No tenderness of the hand or decreased range of motion.  Overall decreased strength on the right secondary to pain.  Sensation intact.    Neurological: He is alert.  Nursing note and vitals reviewed.  ED Treatments / Results  DIAGNOSTIC STUDIES: Oxygen Saturation is 99% on RA, normal by my interpretation.   COORDINATION OF CARE: 10:37 AM-Discussed next steps with pt. Pt verbalized understanding and is agreeable with the plan.   Labs (all labs ordered are listed, but only abnormal results are displayed) Labs Reviewed - No data to display  EKG  EKG Interpretation None      Radiology Dg Cervical Spine Complete  Result Date: 12/09/2016 CLINICAL DATA:  Chronic low back pain. Polysubstance abuse. Night sweats. EXAM: CERVICAL SPINE - COMPLETE 4+ VIEW COMPARISON:  Cervical spine CT 06/06/2013 FINDINGS: Degenerative disc narrowing with endplate spurring and sclerosis from C3-4 to C6-7, similar to prior. No evidence of superimposed fracture or discitis. No evidence of aggressive bone lesion. No prevertebral thickening. Right C6-7 moderate foraminal narrowing from uncovertebral ridging. IMPRESSION: 1. No acute finding. 2. Disc degeneration from C3-4 to C6-7, similar to 2014 CT. Moderate right foraminal narrowing at C6-7. Electronically Signed   By: Monte Fantasia M.D.   On: 12/09/2016 11:19   Dg Thoracic Spine 2 View  Result Date: 12/09/2016 CLINICAL DATA:  Back pain.  No  known injury.  Polysubstance abuse. EXAM: THORACIC SPINE 2 VIEWS COMPARISON:  Chest radiographs dated 03/11/2016. FINDINGS: Minimal anterior spur formation at multiple levels. No fracture or subluxation. There is no swimmer's view to evaluate the upper thoracic spine in the lateral projection. This is included on the cervical spine radiographs obtained at the same time, demonstrating a normal upper thoracic spine. IMPRESSION: Minimal multilevel degenerative changes.  No acute abnormality. Electronically Signed   By: Claudie Revering M.D.   On: 12/09/2016 11:17   Dg Shoulder Right  Result Date: 12/09/2016 CLINICAL DATA:  Chronic low back pain, neck pain, right shoulder pain, radiculopathy, night sweating EXAM: RIGHT SHOULDER - 2+ VIEW COMPARISON:  None. FINDINGS: Two views of the right shoulder submitted. No acute fracture or subluxation. No radiopaque foreign body. AC joint and glenohumeral joint are preserved. IMPRESSION: Negative. Electronically  Signed   By: Lahoma Crocker M.D.   On: 12/09/2016 11:13   Dg Wrist Complete Right  Result Date: 12/09/2016 CLINICAL DATA:  Right wrist pain. No known injury. Polysubstance abuse. EXAM: RIGHT WRIST - COMPLETE 3+ VIEW COMPARISON:  None. FINDINGS: Triangular fibrocartilage calcification. Calcification in the scapholunate ligament. Mild elongation of the distal ulna relative to the distal radius. Otherwise, normal appearing bones and soft tissues. IMPRESSION: Chondrocalcinosis and mild positive ulnar variance. Otherwise, normal examination. Electronically Signed   By: Claudie Revering M.D.   On: 12/09/2016 11:15    Procedures Procedures   Medications Ordered in ED Medications  ibuprofen (ADVIL,MOTRIN) tablet 600 mg (600 mg Oral Given 12/09/16 1116)    Initial Impression / Assessment and Plan / ED Course  I have reviewed the triage vital signs and the nursing notes.  Pertinent labs & imaging results that were available during my care of the patient were reviewed by me and  considered in my medical decision making (see chart for details).     Afebrile, nontoxic patient with right upper extremity and right upper back pain that is chronic, recurrent.  Imaging largely unchanged from prior, significant for DDD.  Likely radicular pain.  Strength testing limited by pain, doubt emergent cord pathology or impingement.  D/C home with symptomatic treatment, PCP follow up.   Discussed result, findings, treatment, and follow up  with patient.  Pt given return precautions.  Pt verbalizes understanding and agrees with plan.       Final Clinical Impressions(s) / ED Diagnoses   Final diagnoses:  Pain  Chronic pain of right upper extremity  DDD (degenerative disc disease), cervical   New Prescriptions Discharge Medication List as of 12/09/2016 11:36 AM    START taking these medications   Details  meloxicam (MOBIC) 7.5 MG tablet Take 1 tablet (7.5 mg total) by mouth daily., Starting Tue 12/09/2016, Print    methocarbamol (ROBAXIN) 500 MG tablet Take 1-2 tablets (500-1,000 mg total) by mouth every 6 (six) hours as needed for muscle spasms (and pain)., Starting Tue 12/09/2016, Print    predniSONE (STERAPRED UNI-PAK 21 TAB) 10 MG (21) TBPK tablet Take by mouth daily. Day 1: take 6 tabs.  Day 2: 5 tabs  Day 3: 4 tabs  Day 4: 3 tabs  Day 5: 2 tabs  Day 6: 1 tab, Starting Tue 12/09/2016, Print       I personally performed the services described in this documentation, which was scribed in my presence. The recorded information has been reviewed and is accurate.     Clayton Bibles, PA-C 12/09/16 2045    Dorie Rank, MD 12/10/16 [phone]

## 2016-12-09 NOTE — Discharge Instructions (Signed)
Read the information below.  Use the prescribed medication as directed.  Please discuss all new medications with your pharmacist.  You may return to the Emergency Department at any time for worsening condition or any new symptoms that concern you.    If you develop fevers, loss of control of bowel or bladder, persistent weakness or numbness in your arms, or are unable to walk, return to the ER for a recheck.

## 2017-04-03 ENCOUNTER — Emergency Department (HOSPITAL_COMMUNITY)
Admission: EM | Admit: 2017-04-03 | Discharge: 2017-04-03 | Disposition: A | Discharge status: Home / Self Care | Payer: Medicaid Other | Attending: Emergency Medicine | Admitting: Emergency Medicine

## 2017-04-03 ENCOUNTER — Emergency Department (HOSPITAL_COMMUNITY): Payer: Medicaid Other

## 2017-04-03 ENCOUNTER — Encounter (HOSPITAL_COMMUNITY): Payer: Self-pay | Admitting: Emergency Medicine

## 2017-04-03 DIAGNOSIS — F17210 Nicotine dependence, cigarettes, uncomplicated: Secondary | ICD-10-CM | POA: Insufficient documentation

## 2017-04-03 DIAGNOSIS — M79671 Pain in right foot: Secondary | ICD-10-CM | POA: Diagnosis present

## 2017-04-03 DIAGNOSIS — Z79899 Other long term (current) drug therapy: Secondary | ICD-10-CM | POA: Diagnosis not present

## 2017-04-03 NOTE — ED Provider Notes (Signed)
[facility] DEPT Provider Note   CSN: [id number] Arrival date & time: 04/03/17  1521   By signing my name below, I, Lucas Walker, attest that this documentation has been prepared under the direction and in the presence of Lucas Hora, PA-C. Electronically signed, Lucas Walker, ED Scribe. 04/03/17. 4:32 PM.  History   Chief Complaint Chief Complaint  Patient presents with  . Foot Pain   The history is provided by the patient and medical records. No language interpreter was used.    Lucas Walker is a 56 y.o. male with h/o arthritis presenting to the Emergency Department concerning intermittent  R > L foot pain x 1 week. Pt describes 5/10 aches in the R foot that are worse with prolonged ambulation and minimally eased after resting. Pt adds prolonged walking leads to L foot pain occasionally. He also describes a painful sensation as though the R foot has been twisted. He states he walks "a lot" daily. Pt also expresses concern for a bump and callusing to the R foot. No h/o similar symptoms noted. No known trauma/ injury. No redness, warmth, fever, numbness or tingling. No other complaints at this time. No PCP noted for F/U.  Past Medical History:  Diagnosis Date  . Arthritis    neck and left shoulder  . Chronic low back pain   . ETOH abuse    cociane abuse, tobacco abuse  . Tobacco dependence     Patient Active Problem List   Diagnosis Date Noted  . Left groin pain 06/05/2015  . Arthritis 06/05/2015  . Healthcare maintenance 06/05/2015  . SUBSTANCE ABUSE, MULTIPLE 10/11/2009  . LIPOMA 10/10/2009  . NICOTINE ADDICTION 10/10/2009  . NECK PAIN 10/10/2009  . BACK PAIN, CHRONIC 07/11/2008    History reviewed. No pertinent surgical history.     Home Medications    Prior to Admission medications   Medication Sig Start Date End Date Taking? Authorizing Provider  meloxicam (MOBIC) 7.5 MG tablet Take 1 tablet (7.5 mg total) by mouth daily. 12/09/16   Clayton Bibles, PA-C    methocarbamol (ROBAXIN) 500 MG tablet Take 1-2 tablets (500-1,000 mg total) by mouth every 6 (six) hours as needed for muscle spasms (and pain). 12/09/16   Clayton Bibles, PA-C  predniSONE (STERAPRED UNI-PAK 21 TAB) 10 MG (21) TBPK tablet Take by mouth daily. Day 1: take 6 tabs.  Day 2: 5 tabs  Day 3: 4 tabs  Day 4: 3 tabs  Day 5: 2 tabs  Day 6: 1 tab 12/09/16   Clayton Bibles, PA-C    Family History Family History  Problem Relation Age of Onset  . Osteoarthritis Mother     Social History Social History  Substance Use Topics  . Smoking status: Current Every Day Smoker    Packs/day: 0.50    Types: Cigarettes    Last attempt to quit: 03/04/2009  . Smokeless tobacco: Never Used     Comment: 1/2 PPD  . Alcohol use 2.4 oz/week    4 Cans of beer per week     Comment: 3- 40oz per day     Allergies   Patient has no known allergies.   Review of Systems Review of Systems  Constitutional: Negative for chills, diaphoresis and fever.  Cardiovascular: Negative for leg swelling.  Gastrointestinal: Negative for nausea and vomiting.  Musculoskeletal: Positive for arthralgias and gait problem.  Skin: Negative for color change and wound.  Neurological: Negative for weakness and numbness.  All other systems reviewed and are negative.  Physical Exam Updated Vital Signs BP 96/70   Pulse 87   Temp 97.6 F (36.4 C) (Oral)   Resp 20   SpO2 97%   Physical Exam  Constitutional: He is oriented to person, place, and time. He appears well-developed and well-nourished.  HENT:  Head: Normocephalic.  Eyes: EOM are normal.  Neck: Normal range of motion.  Pulmonary/Chest: Effort normal.  Abdominal: He exhibits no distension.  Musculoskeletal: Normal range of motion. He exhibits tenderness.  No swelling of the R foot. Deformity of the 5th metatarsal with callus formation, tenderness with palpation of the area. NVI.  Neurological: He is alert and oriented to person, place, and time.  Psychiatric: He  has a normal mood and affect.  Nursing note and vitals reviewed.    ED Treatments / Results  DIAGNOSTIC STUDIES: Oxygen Saturation is 97% on RA, NL by my interpretation.    COORDINATION OF CARE: 4:31 PM-Discussed next steps with pt. Pt verbalized understanding and is agreeable with the plan. Will order Xr.   Labs (all labs ordered are listed, but only abnormal results are displayed) Labs Reviewed - No data to display  EKG  EKG Interpretation None       Radiology Dg Foot Complete Right  Result Date: 04/03/2017 CLINICAL DATA:  Right foot pain.  Pain with walking.  No injury. EXAM: RIGHT FOOT COMPLETE - 3+ VIEW COMPARISON:  No recent prior. FINDINGS: No acute bony or joint abnormality identified. No evidence of fracture or dislocation. Degenerative changes first metatarsophalangeal joint. Rounded calcification noted between the right first and second metatarsals. This may be vascular. IMPRESSION: 1. No acute or focal abnormality identified. Degenerative changes first metatarsophalangeal joint . 2.  Peripheral vascular disease cannot be excluded . Electronically Signed   By: Marcello Moores  Register   On: 04/03/2017 17:10    Procedures Procedures (including critical care time)  Medications Ordered in ED Medications - No data to display   Initial Impression / Assessment and Plan / ED Course  I have reviewed the triage vital signs and the nursing notes.  Pertinent labs & imaging results that were available during my care of the patient were reviewed by me and considered in my medical decision making (see chart for details).  56 year old male with bilateral foot pain R>L presents with likely a overuse injury. Vitals are normal. Xray ordered to evaluate stress fracture and this was negative. Discussed results with patient. Advised rest, NSAIDs, and to wear more supportive shoes. Referral to podiatry given.  Final Clinical Impressions(s) / ED Diagnoses   Final diagnoses:  Right foot pain     New Prescriptions New Prescriptions   No medications on file   I personally performed the services described in this documentation, which was scribed in my presence. The recorded information has been reviewed and is accurate.    Recardo Evangelist, PA-C 04/04/17 0940    Isla Pence, MD 04/05/17 [phone]

## 2017-04-03 NOTE — ED Triage Notes (Signed)
Rt foot pain x 1 week  Has  Bump that has been on side of foot for a month or so , no redness

## 2017-04-03 NOTE — ED Notes (Signed)
Patient transported to X-ray 

## 2017-04-03 NOTE — Discharge Instructions (Signed)
Rest your feet if you have severe pain Wear supportive shoes Take Ibuprofen for pain Follow up with podiatry

## 2017-06-04 ENCOUNTER — Emergency Department (HOSPITAL_COMMUNITY): Payer: Medicaid Other

## 2017-06-04 ENCOUNTER — Encounter (HOSPITAL_COMMUNITY): Payer: Self-pay | Admitting: Emergency Medicine

## 2017-06-04 ENCOUNTER — Emergency Department (HOSPITAL_COMMUNITY)
Admission: EM | Admit: 2017-06-04 | Discharge: 2017-06-04 | Disposition: A | Discharge status: Home / Self Care | Payer: Medicaid Other | Attending: Emergency Medicine | Admitting: Emergency Medicine

## 2017-06-04 DIAGNOSIS — Z79899 Other long term (current) drug therapy: Secondary | ICD-10-CM | POA: Diagnosis not present

## 2017-06-04 DIAGNOSIS — F17210 Nicotine dependence, cigarettes, uncomplicated: Secondary | ICD-10-CM | POA: Insufficient documentation

## 2017-06-04 DIAGNOSIS — M25551 Pain in right hip: Secondary | ICD-10-CM | POA: Diagnosis present

## 2017-06-04 LAB — I-STAT CHEM 8, ED
BUN: 10 mg/dL (ref 6–20)
CHLORIDE: 102 mmol/L (ref 101–111)
Calcium, Ion: 1.16 mmol/L (ref 1.15–1.40)
Creatinine, Ser: 1.1 mg/dL (ref 0.61–1.24)
Glucose, Bld: 105 mg/dL — ABNORMAL HIGH (ref 65–99)
HEMATOCRIT: 46 % (ref 39.0–52.0)
Hemoglobin: 15.6 g/dL (ref 13.0–17.0)
POTASSIUM: 4 mmol/L (ref 3.5–5.1)
SODIUM: 138 mmol/L (ref 135–145)
TCO2: 25 mmol/L (ref 22–32)

## 2017-06-04 MED ORDER — MELOXICAM 7.5 MG PO TABS: 7.5000 mg | ORAL_TABLET | Freq: Every day | ORAL | 0 refills | Status: DC

## 2017-06-04 MED ORDER — ACETAMINOPHEN 325 MG PO TABS
650.0000 mg | ORAL_TABLET | Freq: Once | ORAL | Status: AC
  Administered 2017-06-04: 650 mg via ORAL
  Filled 2017-06-04: qty 2

## 2017-06-04 NOTE — Discharge Instructions (Signed)
Please see the information and instructions below regarding your visit.  Your diagnoses today include:  1. Right hip pain    Your exam and x-rays reassuring today that there is no fluid in her hip, or acute injury to your hip to suggest her pain. This is likely an exacerbation of your osteoarthritis.  Tests performed today include: See side panel of your discharge paperwork for testing performed today. Vital signs are listed at the bottom of these instructions.  -Hip xray -Kidney function  Medications prescribed:    Take any prescribed medications only as prescribed, and any over the counter medications only as directed on the packaging.  You are prescribed meloxicam, a non-steroidal anti-inflammatory agent (NSAID) for pain. You may take 10 mg every 24 hours as needed for pain. If still requiring this medication around the clock for acute pain after 10 days, please see your primary healthcare provider.  You may combine this medication with Tylenol, 650 mg every 6 hours, so you are receiving something for pain every 3 hours.  This is not a long-term medication unless under the care and direction of your primary provider. Taking this medication long-term and not under the supervision of a healthcare provider could increase the risk of stomach ulcers, kidney problems, and cardiovascular problems such as high blood pressure.    Home care instructions:  Please follow any educational materials contained in this packet. Additionally, you may apply icy hot or ice packs to the hip.   Follow-up instructions: Please follow-up with your primary care provider as soon as possible for further evaluation of your symptoms if they are not completely improved.   Please follow up with the orthopedic specialist on this paperwork in as soon as possible.  Return instructions:  Please return to the Emergency Department if you experience worsening symptoms. \ Please return for any worsening hip pain,  numbness or tingling in the lower leg, weakness in her legs, numbness in her groin, loss of bowel or bladder control, or retention of urine. Please return for any fever in the setting of your hip pain. Please return if you have any other emergent concerns.  Additional Information:   Your vital signs today were: BP 101/69 (BP Location: Right Arm)    Pulse 70    Temp 98 F (36.7 C) (Oral)    Resp 18    Ht 6' (1.829 m)    Wt 65.8 kg (145 lb)    SpO2 96%    BMI 19.67 kg/m  If your blood pressure (BP) was elevated on multiple readings during this visit above 130 for the top number or above 80 for the bottom number, please have this repeated by your primary care provider within one month. --------------  Thank you for allowing [place] to participate in your care today.

## 2017-06-04 NOTE — ED Provider Notes (Signed)
[facility] DEPT Provider Note   CSN: [id number] Arrival date & time: 06/04/17  1120     History   Chief Complaint Chief Complaint  Patient presents with  . Hip Pain    HPI Lucas Walker is a 56 y.o. male.  HPI   Patient is a 56 year old male with a history of chronic low back pain and osteoarthritis presenting for acute onset of right hip pain yesterday evening. Patient reports there was no associated injury or event with the onset of this pain. Patient reports pain is approximately 7 out of 10. Patient has been trying Tylenol for the pain. Patient reports he has been nonweightbearing on that right hip since this event. Patient reports pain radiates into the groin. Patient reports he had chills last night but did not record any fevers at home. Patient denies any swelling, erythema, or weakness of the right leg. Patient denies any immuno suppressed status, history of cancer, or IVDU. Patient reports that he has recurrently had pain in his hip in the past.  Past Medical History:  Diagnosis Date  . Arthritis    neck and left shoulder  . Chronic low back pain   . ETOH abuse    cociane abuse, tobacco abuse  . Tobacco dependence     Patient Active Problem List   Diagnosis Date Noted  . Left groin pain 06/05/2015  . Arthritis 06/05/2015  . Healthcare maintenance 06/05/2015  . SUBSTANCE ABUSE, MULTIPLE 10/11/2009  . LIPOMA 10/10/2009  . NICOTINE ADDICTION 10/10/2009  . NECK PAIN 10/10/2009  . BACK PAIN, CHRONIC 07/11/2008    History reviewed. No pertinent surgical history.     Home Medications    Prior to Admission medications   Medication Sig Start Date End Date Taking? Authorizing Provider  meloxicam (MOBIC) 7.5 MG tablet Take 1 tablet (7.5 mg total) by mouth daily. 12/09/16   Clayton Bibles, PA-C  methocarbamol (ROBAXIN) 500 MG tablet Take 1-2 tablets (500-1,000 mg total) by mouth every 6 (six) hours as needed for muscle spasms (and pain). 12/09/16   Clayton Bibles, PA-C    predniSONE (STERAPRED UNI-PAK 21 TAB) 10 MG (21) TBPK tablet Take by mouth daily. Day 1: take 6 tabs.  Day 2: 5 tabs  Day 3: 4 tabs  Day 4: 3 tabs  Day 5: 2 tabs  Day 6: 1 tab 12/09/16   Clayton Bibles, PA-C    Family History Family History  Problem Relation Age of Onset  . Osteoarthritis Mother     Social History Social History  Substance Use Topics  . Smoking status: Current Every Day Smoker    Packs/day: 0.50    Types: Cigarettes    Last attempt to quit: 03/04/2009  . Smokeless tobacco: Never Used     Comment: 1/2 PPD  . Alcohol use 2.4 oz/week    4 Cans of beer per week     Comment: 3- 40oz per day     Allergies   Patient has no known allergies.   Review of Systems Review of Systems  Constitutional: Negative for chills and fever.  Cardiovascular: Negative for leg swelling.  Gastrointestinal: Negative for nausea and vomiting.  Musculoskeletal: Positive for arthralgias, back pain, gait problem and myalgias.     Physical Exam Updated Vital Signs BP 101/69 (BP Location: Right Arm)   Pulse 70   Temp 98 F (36.7 C) (Oral)   Resp 18   Ht 6' (1.829 m)   Wt 65.8 kg (145 lb)   SpO2 96%  BMI 19.67 kg/m   Physical Exam  Constitutional: He appears well-developed. No distress.  Patient appears thin and poorly nourished.  HENT:  Head: Normocephalic and atraumatic.  Eyes: Conjunctivae are normal. Right eye exhibits no discharge. Left eye exhibits no discharge.  EOMs normal to gross examination.  Neck: Normal range of motion.  Cardiovascular: Normal rate and regular rhythm.   Intact, 2+ radial pulse.  Pulmonary/Chest:  Normal respiratory effort. Patient converses comfortably. No audible wheeze or stridor.  Abdominal: He exhibits no distension.  Musculoskeletal: Normal range of motion.       Right hip: He exhibits tenderness. He exhibits normal range of motion, no swelling and no deformity.  No erythema or swelling over right hip, trochanteric bursa, or issue  bursa. Patient is point tender over hamstring insertions and groin and gluteal bursa. No calf tenderness. Patient able to internally and externally rotate right hip, but flexion and extension, abduction, adduction of right hip limited due to pain.  Neurological: He is alert.  Spine Exam: Inspection/Palpation: No midline cervical, thoracic, or lumbar paraspinal muscular tenderness.  Strength: 5/5 throughout LE bilaterally (unable to assess hip ext/flex, abd/add fully d/t hip pain, knee flexion/extension; foot dorsiflexion/plantarflexion, inversion/eversion; great toe inversion) Sensation: Intact to light touch in proximal and distal LE bilaterally Reflexes: 1+ quadriceps and achilles reflexes; symmetric.  Skin: Skin is warm and dry. He is not diaphoretic.  Psychiatric: He has a normal mood and affect. His behavior is normal. Judgment and thought content normal.  Nursing note and vitals reviewed.    ED Treatments / Results  Labs (all labs ordered are listed, but only abnormal results are displayed) Labs Reviewed  I-STAT CHEM 8, ED    EKG  EKG Interpretation None       Radiology No results found.  Procedures Procedures (including critical care time)  Medications Ordered in ED Medications  acetaminophen (TYLENOL) tablet 650 mg (650 mg Oral Given 06/04/17 1357)     Initial Impression / Assessment and Plan / ED Course  I have reviewed the triage vital signs and the nursing notes.  Pertinent labs & imaging results that were available during my care of the patient were reviewed by me and considered in my medical decision making (see chart for details).  Clinical Course as of Jun 04 1609  Thu Jun 04, 2017  1521 Negative xray noted.   [AM]    Clinical Course User Index [AM] Albesa Seen, PA-C    Final Clinical Impressions(s) / ED Diagnoses   Final diagnoses:  None   MDM Patient is a 56 year old male with a history of chronic low back pain and osteoarthritis  presenting for acute onset of right hip pain yesterday evening. Patient has no red flags for avascular necrosis, osteomyelitis, or septic joint, or septic bursitis of the hip. X-ray showing degenerative changes but no acute fracture or findings to suggest cause of hip pain. Patient will be treated symptomatically with meloxicam and orthopedic follow-up for symptomatic osteoarthritis flares. Patient is also neurovascularly intact in the lower extremities and do not suspect that this is pain radiating from the spine. Patient is in understanding and agrees with the plan of care.  This is a supervised visit with Dr. Theotis Burrow. Evaluation, management, and discharge planning discussed with this attending physician.  New Prescriptions New Prescriptions   No medications on file     Tamala Julian 06/04/17 [place], Ankit, MD 06/05/17 [phone]

## 2017-06-04 NOTE — ED Triage Notes (Signed)
Patient c/o right hip pain since yesterday. Denies injury. Hx arthritis.

## 2018-03-31 IMAGING — CR DG CHEST 2V
2 series · 2 of 2 positions shown · non-contrast
Comparison: None.

CLINICAL DATA: 55-year-old male shortness of breath

EXAM:
CHEST  2 VIEW

[chest pa]
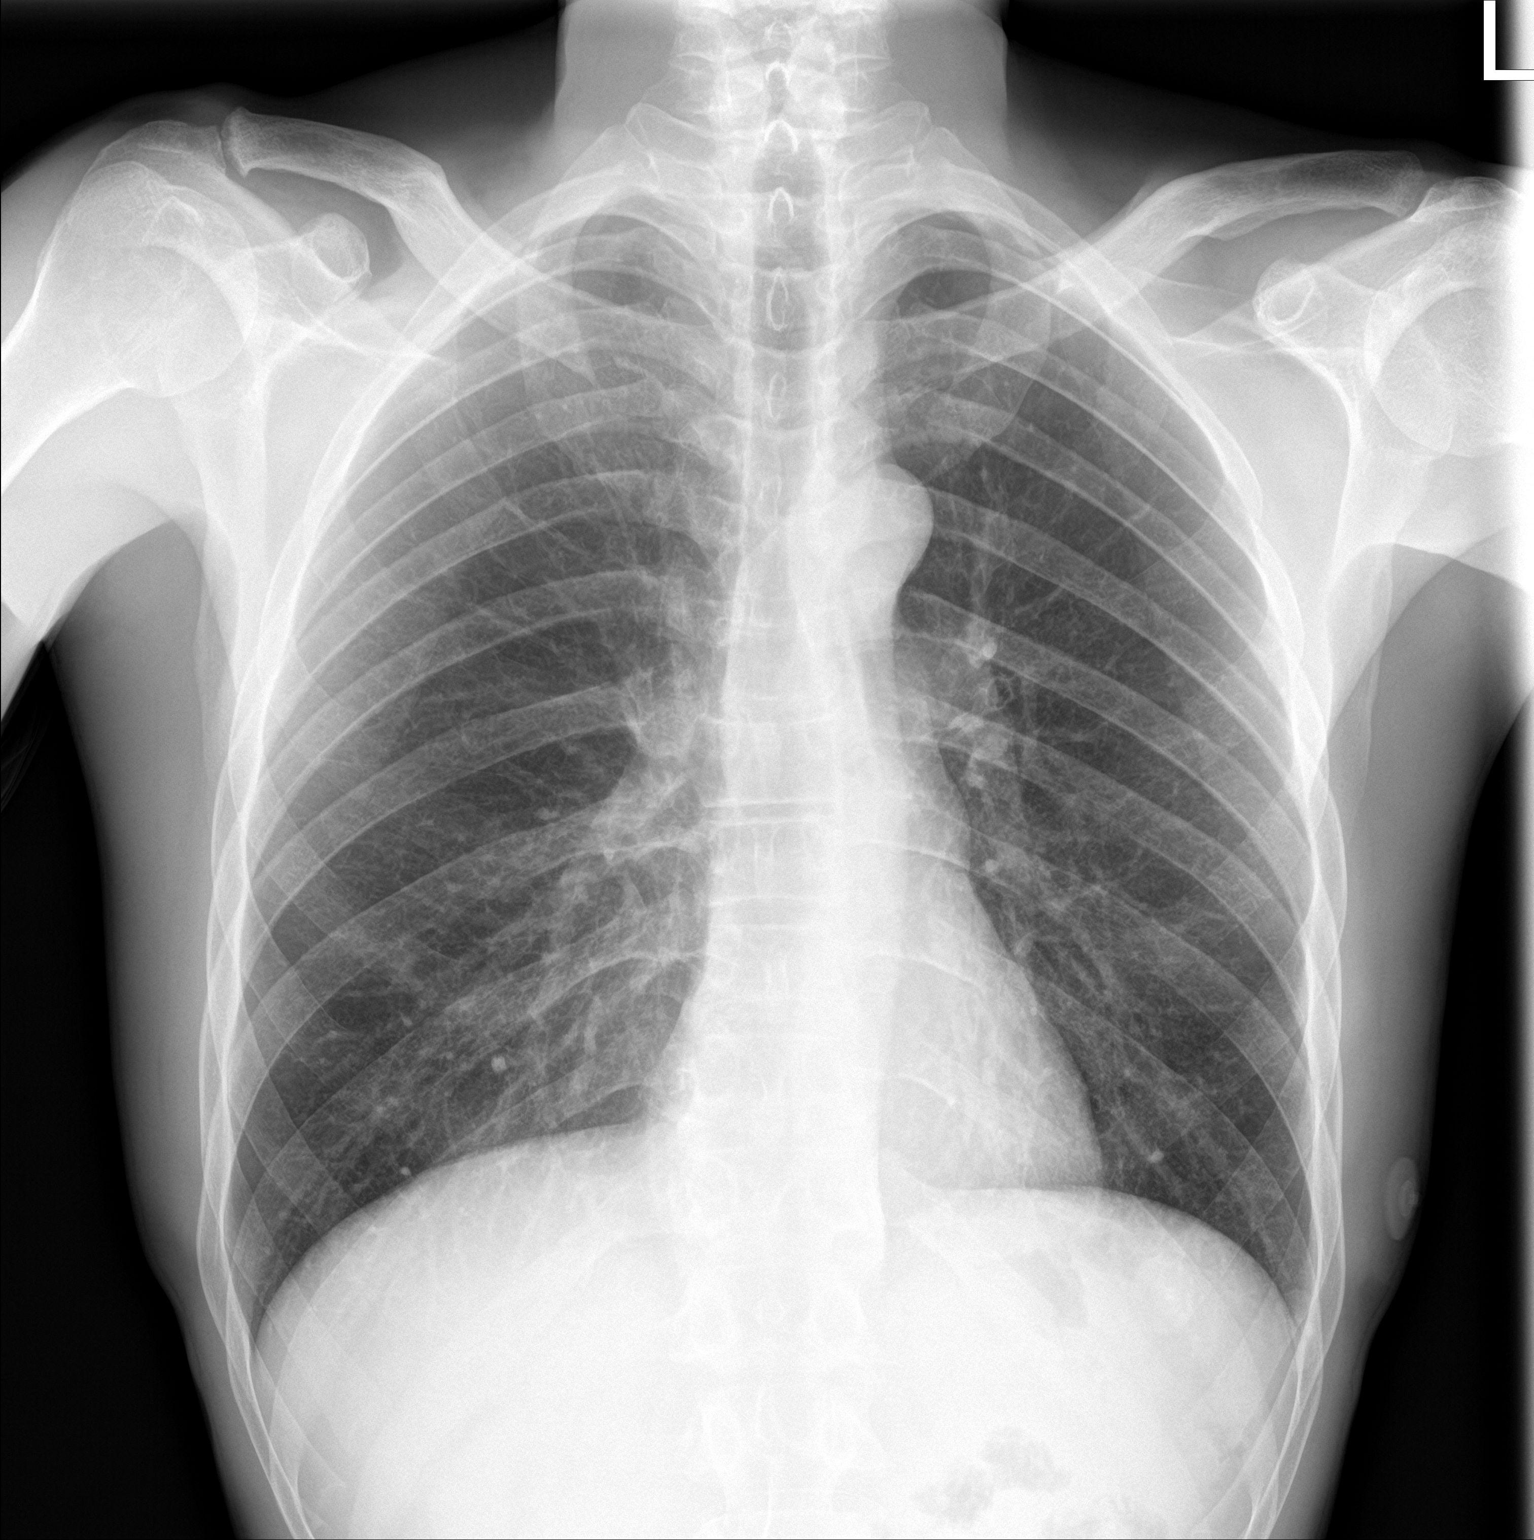

[chest lat]
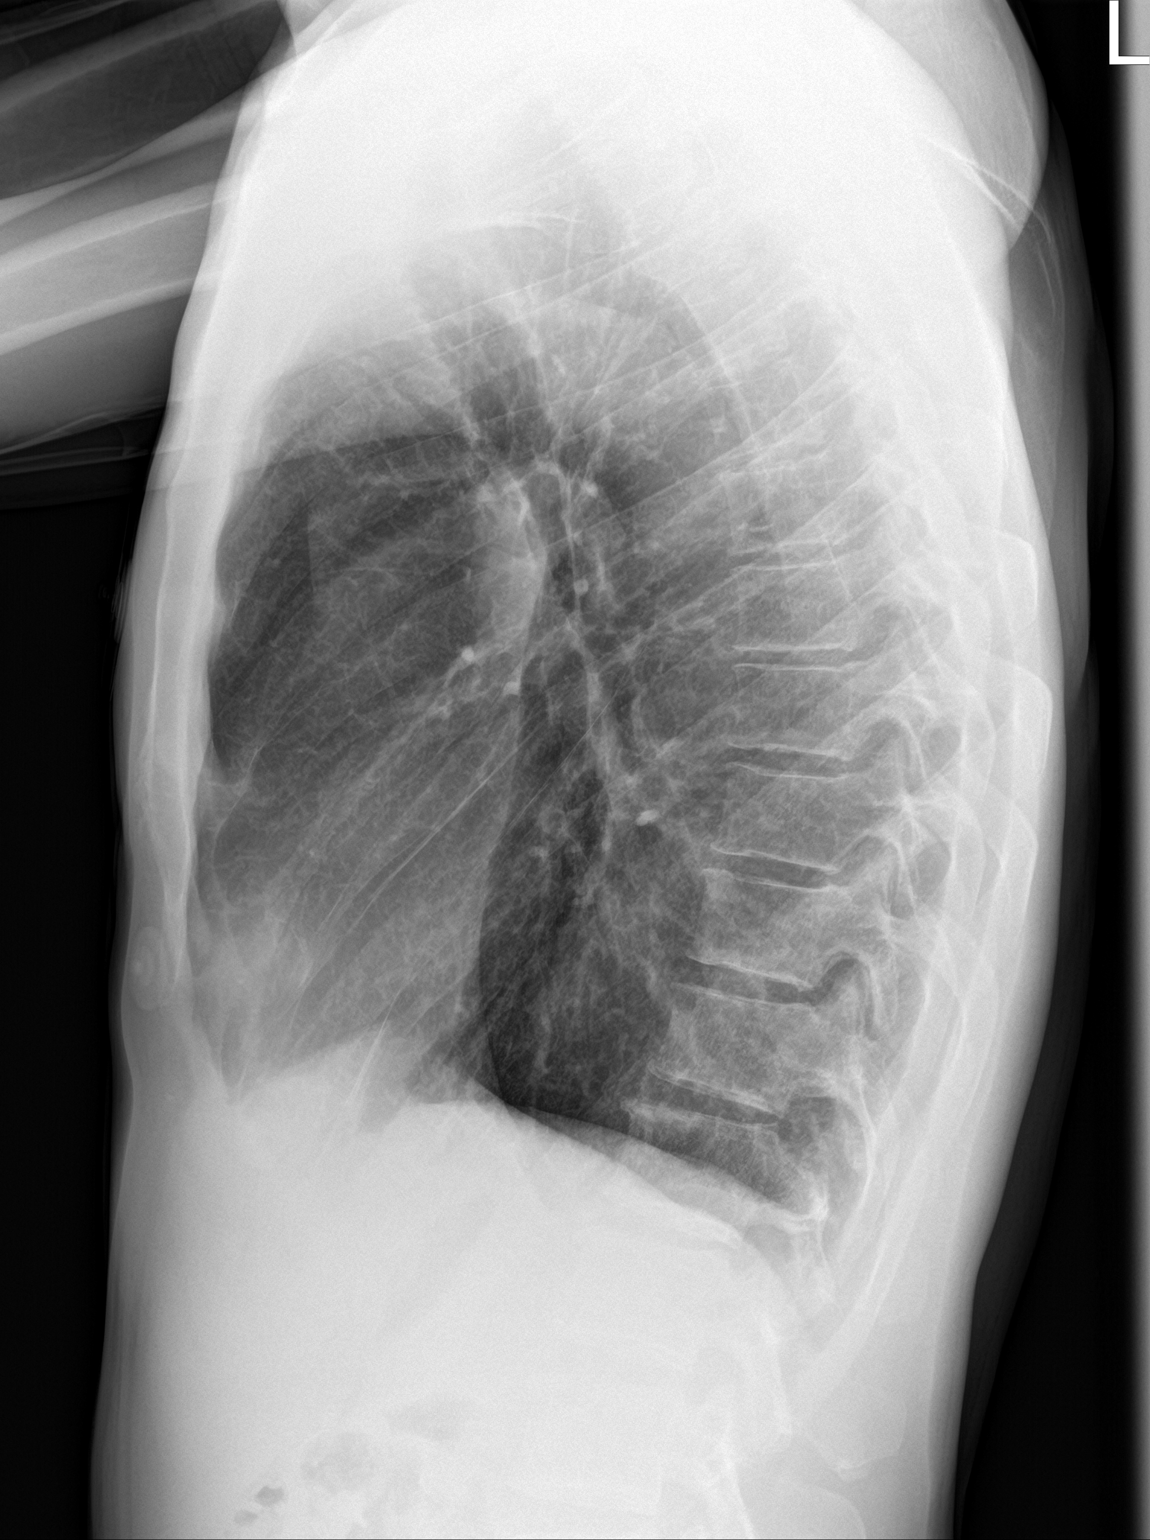

[2 of 2 positions shown; findings below may reference images not displayed]

FINDINGS: The heart size and mediastinal contours are within normal limits.
Both lungs are clear. The visualized skeletal structures are
unremarkable.
IMPRESSION: No radiographic evidence of acute cardiopulmonary disease.

## 2018-05-03 ENCOUNTER — Emergency Department (HOSPITAL_COMMUNITY)
Admission: EM | Admit: 2018-05-03 | Discharge: 2018-05-03 | Disposition: A | Discharge status: Home / Self Care | Payer: Medicaid Other | Attending: Emergency Medicine | Admitting: Emergency Medicine

## 2018-05-03 ENCOUNTER — Emergency Department (HOSPITAL_COMMUNITY): Payer: Medicaid Other

## 2018-05-03 ENCOUNTER — Other Ambulatory Visit: Payer: Self-pay

## 2018-05-03 ENCOUNTER — Encounter (HOSPITAL_COMMUNITY): Payer: Self-pay

## 2018-05-03 DIAGNOSIS — Z79899 Other long term (current) drug therapy: Secondary | ICD-10-CM | POA: Insufficient documentation

## 2018-05-03 DIAGNOSIS — F17210 Nicotine dependence, cigarettes, uncomplicated: Secondary | ICD-10-CM | POA: Diagnosis not present

## 2018-05-03 DIAGNOSIS — M25552 Pain in left hip: Secondary | ICD-10-CM | POA: Insufficient documentation

## 2018-05-03 DIAGNOSIS — R52 Pain, unspecified: Secondary | ICD-10-CM | POA: Diagnosis not present

## 2018-05-03 DIAGNOSIS — I10 Essential (primary) hypertension: Secondary | ICD-10-CM | POA: Diagnosis not present

## 2018-05-03 DIAGNOSIS — Z049 Encounter for examination and observation for unspecified reason: Secondary | ICD-10-CM | POA: Diagnosis not present

## 2018-05-03 MED ORDER — ACETAMINOPHEN 325 MG PO TABS
650.0000 mg | ORAL_TABLET | Freq: Once | ORAL | Status: AC
  Administered 2018-05-03: 650 mg via ORAL
  Filled 2018-05-03: qty 2

## 2018-05-03 NOTE — ED Triage Notes (Signed)
Per EMS:  Pt had hip pain when he woke up this am  Pt hx of hip dislocation.  No deformities on palpation, just pain.

## 2018-05-03 NOTE — ED Provider Notes (Signed)
[facility] DEPT Provider Note   CSN: [id number] Arrival date & time: 05/03/18  1139     History   Chief Complaint Chief Complaint  Patient presents with  . Hip Pain    HPI Lucas Walker is a 57 y.o. male.  The history is provided by the patient.  Leg Pain   This is a chronic problem. The current episode started more than 1 week ago. The problem occurs every several days. The problem has not changed since onset.The pain is present in the left hip. The quality of the pain is described as aching. The pain is at a severity of 4/10. The pain is moderate. Associated symptoms include limited range of motion and stiffness. Pertinent negatives include no numbness, no tingling and no itching. He has tried nothing for the symptoms. The treatment provided no relief. There has been no history of extremity trauma.    Past Medical History:  Diagnosis Date  . Arthritis    neck and left shoulder  . Chronic low back pain   . ETOH abuse    cociane abuse, tobacco abuse  . Tobacco dependence     Patient Active Problem List   Diagnosis Date Noted  . Left groin pain 06/05/2015  . Arthritis 06/05/2015  . Healthcare maintenance 06/05/2015  . SUBSTANCE ABUSE, MULTIPLE 10/11/2009  . LIPOMA 10/10/2009  . NICOTINE ADDICTION 10/10/2009  . NECK PAIN 10/10/2009  . BACK PAIN, CHRONIC 07/11/2008    History reviewed. No pertinent surgical history.      Home Medications    Prior to Admission medications   Medication Sig Start Date End Date Taking? Authorizing Provider  meloxicam (MOBIC) 7.5 MG tablet Take 1 tablet (7.5 mg total) by mouth daily. 06/04/17   Langston Masker B, PA-C  methocarbamol (ROBAXIN) 500 MG tablet Take 1-2 tablets (500-1,000 mg total) by mouth every 6 (six) hours as needed for muscle spasms (and pain). 12/09/16   Clayton Bibles, PA-C  predniSONE (STERAPRED UNI-PAK 21 TAB) 10 MG (21) TBPK tablet Take by mouth daily. Day 1: take 6 tabs.  Day 2: 5 tabs   Day 3: 4 tabs  Day 4: 3 tabs  Day 5: 2 tabs  Day 6: 1 tab 12/09/16   Clayton Bibles, PA-C    Family History Family History  Problem Relation Age of Onset  . Osteoarthritis Mother     Social History Social History   Tobacco Use  . Smoking status: Current Every Day Smoker    Packs/day: 0.50    Types: Cigarettes    Last attempt to quit: 03/04/2009    Years since quitting: 9.1  . Smokeless tobacco: Never Used  . Tobacco comment: 1/2 PPD  Substance Use Topics  . Alcohol use: Yes    Alcohol/week: 5.0 standard drinks    Types: 5 Cans of beer per week    Comment: 3- 40oz per day  . Drug use: Yes    Comment: denies cociane use presently but UDS positive     Allergies   Patient has no known allergies.   Review of Systems Review of Systems  Constitutional: Negative for chills and fever.  HENT: Negative for ear pain and sore throat.   Eyes: Negative for pain and visual disturbance.  Respiratory: Negative for cough and shortness of breath.   Cardiovascular: Negative for chest pain and palpitations.  Gastrointestinal: Negative for abdominal pain and vomiting.  Genitourinary: Negative for dysuria and hematuria.  Musculoskeletal: Positive for gait problem and stiffness.  Negative for arthralgias and back pain.  Skin: Negative for color change, itching and rash.  Neurological: Negative for tingling, seizures, syncope and numbness.  All other systems reviewed and are negative.    Physical Exam Updated Vital Signs  ED Triage Vitals  Enc Vitals Group     BP 05/03/18 1148 112/74     Pulse Rate 05/03/18 1148 77     Resp 05/03/18 1148 16     Temp 05/03/18 1148 98.4 F (36.9 C)     Temp Source 05/03/18 1148 Oral     SpO2 05/03/18 1145 99 %     Weight 05/03/18 1146 145 lb (65.8 kg)     Height 05/03/18 1146 6' (1.829 m)     Head Circumference --      Peak Flow --      Pain Score --      Pain Loc --      Pain Edu? --      Excl. in [place]? --     Physical Exam  Constitutional: He is  oriented to person, place, and time. He appears well-developed and well-nourished.  HENT:  Head: Normocephalic and atraumatic.  Eyes: Pupils are equal, round, and reactive to light. Conjunctivae and EOM are normal.  Neck: Normal range of motion. Neck supple.  Cardiovascular: Normal rate, regular rhythm, normal heart sounds and intact distal pulses.  No murmur heard. Pulmonary/Chest: Effort normal and breath sounds normal. No respiratory distress.  Abdominal: Soft. There is no tenderness.  Musculoskeletal: He exhibits tenderness (TTP to left hip with ROM and to palpation ). He exhibits no edema or deformity.  Neurological: He is alert and oriented to person, place, and time.  Skin: Skin is warm and dry.  Psychiatric: He has a normal mood and affect.  Nursing note and vitals reviewed.    ED Treatments / Results  Labs (all labs ordered are listed, but only abnormal results are displayed) Labs Reviewed - No data to display  EKG None  Radiology Dg Hip Unilat W Or Wo Pelvis 1 View Left  Result Date: 05/03/2018 CLINICAL DATA:  Woke up with hip pain. EXAM: DG HIP (WITH OR WITHOUT PELVIS) 1V*L* COMPARISON:  06/04/2015 FINDINGS: Pelvic bony ring is intact. Left hip is located without a fracture. Normal appearance of the SI joints. Bowel gas throughout the lower abdomen and pelvis. IMPRESSION: No acute abnormality. Electronically Signed   By: Markus Daft M.D.   On: 05/03/2018 12:26    Procedures Procedures (including critical care time)  Medications Ordered in ED Medications  acetaminophen (TYLENOL) tablet 650 mg (650 mg Oral Given 05/03/18 1226)     Initial Impression / Assessment and Plan / ED Course  I have reviewed the triage vital signs and the nursing notes.  Pertinent labs & imaging results that were available during my care of the patient were reviewed by me and considered in my medical decision making (see chart for details).     Lucas Walker is a 57 year old male with  history of chronic low back pain who presents to the ED with left hip pain.  Patient with unremarkable vitals.  No fever.  Patient denies any trauma.  Has tenderness over the left hip but no obvious deformity.  Patient neurovascularly intact.  Patient has no other tenderness elsewhere.  No midline spinal tenderness.  X-ray showed no acute fractures or malalignment.  Patient felt better after some Tylenol.  Suspect may be some arthritis pain.  Recommend follow-up with primary care  doctor.  Patient ambulated and was discharged from ED in good condition.  Told to return to ED if symptoms persist.  There is always a risk of occult fracture however given no trauma low concern at this time.  Final Clinical Impressions(s) / ED Diagnoses   Final diagnoses:  Left hip pain    ED Discharge Orders    None       Lennice Sites, DO 05/03/18 1344

## 2018-05-03 NOTE — ED Notes (Signed)
Bed: [facility] Expected date:  Expected time:  Means of arrival:  Comments: EMS-hip pain 

## 2018-05-26 ENCOUNTER — Emergency Department (HOSPITAL_COMMUNITY)
Admission: EM | Admit: 2018-05-26 | Discharge: 2018-05-27 | Disposition: A | Discharge status: Home / Self Care | Payer: Medicaid Other | Attending: Emergency Medicine | Admitting: Emergency Medicine

## 2018-05-26 ENCOUNTER — Emergency Department (HOSPITAL_COMMUNITY): Payer: Medicaid Other

## 2018-05-26 ENCOUNTER — Other Ambulatory Visit: Payer: Self-pay

## 2018-05-26 ENCOUNTER — Encounter (HOSPITAL_COMMUNITY): Payer: Self-pay

## 2018-05-26 DIAGNOSIS — R52 Pain, unspecified: Secondary | ICD-10-CM | POA: Diagnosis not present

## 2018-05-26 DIAGNOSIS — M5412 Radiculopathy, cervical region: Secondary | ICD-10-CM

## 2018-05-26 DIAGNOSIS — M542 Cervicalgia: Secondary | ICD-10-CM | POA: Insufficient documentation

## 2018-05-26 DIAGNOSIS — F17210 Nicotine dependence, cigarettes, uncomplicated: Secondary | ICD-10-CM | POA: Diagnosis not present

## 2018-05-26 DIAGNOSIS — M25512 Pain in left shoulder: Secondary | ICD-10-CM | POA: Insufficient documentation

## 2018-05-26 DIAGNOSIS — M25519 Pain in unspecified shoulder: Secondary | ICD-10-CM | POA: Diagnosis not present

## 2018-05-26 DIAGNOSIS — I959 Hypotension, unspecified: Secondary | ICD-10-CM | POA: Diagnosis not present

## 2018-05-26 DIAGNOSIS — Z79899 Other long term (current) drug therapy: Secondary | ICD-10-CM | POA: Diagnosis not present

## 2018-05-26 NOTE — ED Triage Notes (Signed)
Pt reports L shoulder pain x2 days. No fall or injury noted. Pain worsens with ROM and palpation. Hx of arthritis. A&Ox4.

## 2018-05-27 MED ORDER — PREDNISONE 20 MG PO TABS
20.0000 mg | ORAL_TABLET | Freq: Once | ORAL | Status: AC
  Administered 2018-05-27: 20 mg via ORAL
  Filled 2018-05-27: qty 1

## 2018-05-27 MED ORDER — HYDROCODONE-ACETAMINOPHEN 5-325 MG PO TABS: 1.0000 | ORAL_TABLET | Freq: Four times a day (QID) | ORAL | 0 refills | Status: DC | PRN

## 2018-05-27 MED ORDER — KETOROLAC TROMETHAMINE 60 MG/2ML IM SOLN
60.0000 mg | Freq: Once | INTRAMUSCULAR | Status: AC
  Administered 2018-05-27: 60 mg via INTRAMUSCULAR
  Filled 2018-05-27: qty 2

## 2018-05-27 MED ORDER — PREDNISONE 10 MG PO TABS: 20.0000 mg | ORAL_TABLET | Freq: Two times a day (BID) | ORAL | 0 refills | Status: DC

## 2018-05-27 MED ORDER — OXYCODONE-ACETAMINOPHEN 5-325 MG PO TABS
2.0000 | ORAL_TABLET | Freq: Once | ORAL | Status: AC
  Administered 2018-05-27: 2 via ORAL
  Filled 2018-05-27: qty 2

## 2018-05-27 MED FILL — predniSONE 10 MG TABS: 10 | 5 days supply | Qty: 20 | Fill #0

## 2018-05-27 NOTE — ED Provider Notes (Signed)
[facility] DEPT Provider Note   CSN: [id number] Arrival date & time: 05/26/18  2110     History   Chief Complaint Chief Complaint  Patient presents with  . Shoulder Pain    L    HPI Lucas Walker is a 57 y.o. male.  Patient is a 57 year old male with past medical history of chronic back pain, alcohol abuse, and arthritis.  He presents today with complaints of neck and left shoulder pain.  This is been ongoing for the past week and began in the absence of any injury or trauma.  He describes pain that originates in the left side of his neck and radiates into his shoulder and upper arm.  He denies any numbness or tingling.  He denies any weakness.  He has had similar episodes occur in the past and he has been treated for degenerative disc disease.  I do not see where an MRI or advanced imaging has been ordered.  The history is provided by the patient.  Shoulder Pain   This is a recurrent problem. Episode onset: 1 week ago. The problem occurs constantly. The problem has been rapidly worsening. The pain is present in the neck and left shoulder. The pain is severe. Associated symptoms include limited range of motion. Pertinent negatives include no numbness and no tingling. The symptoms are aggravated by activity, standing and lying down. He has tried nothing for the symptoms. There has been no history of extremity trauma.    Past Medical History:  Diagnosis Date  . Arthritis    neck and left shoulder  . Chronic low back pain   . ETOH abuse    cociane abuse, tobacco abuse  . Tobacco dependence     Patient Active Problem List   Diagnosis Date Noted  . Left groin pain 06/05/2015  . Arthritis 06/05/2015  . Healthcare maintenance 06/05/2015  . SUBSTANCE ABUSE, MULTIPLE 10/11/2009  . LIPOMA 10/10/2009  . NICOTINE ADDICTION 10/10/2009  . NECK PAIN 10/10/2009  . BACK PAIN, CHRONIC 07/11/2008    History reviewed. No pertinent surgical  history.      Home Medications    Prior to Admission medications   Medication Sig Start Date End Date Taking? Authorizing Provider  meloxicam (MOBIC) 7.5 MG tablet Take 1 tablet (7.5 mg total) by mouth daily. 06/04/17   Langston Masker B, PA-C  methocarbamol (ROBAXIN) 500 MG tablet Take 1-2 tablets (500-1,000 mg total) by mouth every 6 (six) hours as needed for muscle spasms (and pain). 12/09/16   Clayton Bibles, PA-C  predniSONE (STERAPRED UNI-PAK 21 TAB) 10 MG (21) TBPK tablet Take by mouth daily. Day 1: take 6 tabs.  Day 2: 5 tabs  Day 3: 4 tabs  Day 4: 3 tabs  Day 5: 2 tabs  Day 6: 1 tab 12/09/16   Clayton Bibles, PA-C    Family History Family History  Problem Relation Age of Onset  . Osteoarthritis Mother     Social History Social History   Tobacco Use  . Smoking status: Current Every Day Smoker    Packs/day: 0.50    Types: Cigarettes    Last attempt to quit: 03/04/2009    Years since quitting: 9.2  . Smokeless tobacco: Never Used  . Tobacco comment: 1/2 PPD  Substance Use Topics  . Alcohol use: Yes    Alcohol/week: 5.0 standard drinks    Types: 5 Cans of beer per week    Comment: 3- 40oz per day  . Drug  use: Yes    Comment: denies cociane use presently but UDS positive     Allergies   Patient has no known allergies.   Review of Systems Review of Systems  Neurological: Negative for tingling and numbness.  All other systems reviewed and are negative.    Physical Exam Updated Vital Signs BP 133/83 (BP Location: Left Arm)   Pulse 61   Temp 98.3 F (36.8 C) (Oral)   Resp 18   SpO2 97%   Physical Exam  Constitutional: He is oriented to person, place, and time. He appears well-developed and well-nourished. No distress.  Appears uncomfortable.  HENT:  Head: Normocephalic and atraumatic.  Neck: Normal range of motion. Neck supple.  There is tenderness to palpation in the soft tissues of the left lateral neck.  Pulmonary/Chest: Effort normal.  Musculoskeletal:   The left upper extremity appears grossly normal.  Ulnar and radial pulses are easily palpable.  He is able to flex and extend all fingers and sensation is intact throughout the entire hand.  Neurological: He is alert and oriented to person, place, and time.  Skin: Skin is warm and dry. He is not diaphoretic.  Nursing note and vitals reviewed.    ED Treatments / Results  Labs (all labs ordered are listed, but only abnormal results are displayed) Labs Reviewed - No data to display  EKG None  Radiology Dg Shoulder Left  Result Date: 05/26/2018 CLINICAL DATA:  Left shoulder pain. EXAM: LEFT SHOULDER - 2+ VIEW COMPARISON:  None. FINDINGS: There is no evidence of fracture or dislocation. The glenohumeral joint shows normal alignment. Mild proliferative disease of the acromion. No bony lesions or destruction. Soft tissues are unremarkable. IMPRESSION: Mild acromial osteophyte formation. Electronically Signed   By: Aletta Edouard M.D.   On: 05/26/2018 21:38    Procedures Procedures (including critical care time)  Medications Ordered in ED Medications  ketorolac (TORADOL) injection 60 mg (has no administration in time range)  oxyCODONE-acetaminophen (PERCOCET/ROXICET) 5-325 MG per tablet 2 tablet (has no administration in time range)  predniSONE (DELTASONE) tablet 20 mg (has no administration in time range)     Initial Impression / Assessment and Plan / ED Course  I have reviewed the triage vital signs and the nursing notes.  Pertinent labs & imaging results that were available during my care of the patient were reviewed by me and considered in my medical decision making (see chart for details).  Patient presents with complaints of pain in his neck and left shoulder that sounds radicular in nature.  He was given Toradol, prednisone, and Percocet and is now feeling better.  There are no neurologic deficits that would suggest an emergent situation.  He will be treated with prednisone,  pain medicine, and is to follow-up with his primary doctor if not improving to discuss physical therapy or dedicated imaging studies.  Final Clinical Impressions(s) / ED Diagnoses   Final diagnoses:  None    ED Discharge Orders    None       Veryl Speak, MD 05/27/18 0140

## 2018-05-27 NOTE — Discharge Instructions (Addendum)
Prednisone as prescribed.  Hydrocodone is prescribed as needed for pain.  Follow-up with a primary doctor if your symptoms are not improving in the next week.

## 2018-07-21 ENCOUNTER — Encounter: Payer: Self-pay | Admitting: Family Medicine

## 2018-07-21 ENCOUNTER — Ambulatory Visit (INDEPENDENT_AMBULATORY_CARE_PROVIDER_SITE_OTHER): Payer: Medicaid Other | Admitting: Family Medicine

## 2018-07-21 VITALS — BP 100/54 | HR 67 | Temp 98.2°F | Ht 72.0 in | Wt 128.0 lb

## 2018-07-21 DIAGNOSIS — F17200 Nicotine dependence, unspecified, uncomplicated: Secondary | ICD-10-CM

## 2018-07-21 DIAGNOSIS — Z0000 Encounter for general adult medical examination without abnormal findings: Secondary | ICD-10-CM | POA: Diagnosis not present

## 2018-07-21 DIAGNOSIS — Z23 Encounter for immunization: Secondary | ICD-10-CM | POA: Diagnosis not present

## 2018-07-21 DIAGNOSIS — F1910 Other psychoactive substance abuse, uncomplicated: Secondary | ICD-10-CM

## 2018-07-21 DIAGNOSIS — Z1159 Encounter for screening for other viral diseases: Secondary | ICD-10-CM

## 2018-07-21 NOTE — Progress Notes (Signed)
Subjective   Patient ID: Lucas Walker    DOB: 05-Aug-1961, 57 y.o. male   MRN: [id number]  CC: "Here to establish care"  HPI: Lucas Walker is a 57 y.o. male who presents to clinic today for the following:  Annual physical exam: Patient here today to establish care.  He has no prior PCP.  Patient typically gets care at the ED.  He has no new complaints today other than his chronic back pain.  He is a current everyday smoker and does report crack cocaine use about 3 weeks ago.  Patient has a history of alcohol use disorder.  Patient is overdue for his colonoscopy and denies history of rectal bleeding, melena or hematochezia, constipation or diarrhea.  Polysubstance use disorder: Patient reports drinking 2, 40 ounce beers daily.  He denies history of alcohol withdrawals or DTs.  He has considered cutting back in the past.  He does report concomitant use of crack cocaine as stated above along with 3 days of cigarettes.  Tobacco use: Patient currently smoking 0.25 packs/day.  He decreased approximately 2 years ago from 1 pack/day since age 70.  He denies fevers or chills, cough or hemoptysis, shortness of breath or chest pain.  Patient does have a 20 pound weight loss over the last 3 months which he attributes to his alcohol and cocaine use.  ROS: see HPI for pertinent.  [place]: Polysubstance use disorder (tobacco, cocaine, alcohol), chronic back pain, OA.  Surgical history unremarkable.  Family history OA.  Smoking status reviewed. Medications reviewed.  Objective   BP (!) 100/54   Pulse 67   Temp 98.2 F (36.8 C) (Oral)   Ht 6' (1.829 m)   Wt 128 lb (58.1 kg)   SpO2 99%   BMI 17.36 kg/m  Vitals and nursing note reviewed.  General: thin male, NAD with non-toxic appearance HEENT: normocephalic, atraumatic, moist mucous membranes Neck: supple, non-tender without lymphadenopathy Cardiovascular: regular rate and rhythm without murmurs, rubs, or gallops Lungs: clear to auscultation  bilaterally with normal work of breathing Abdomen: soft, non-tender, non-distended, normoactive bowel sounds Skin: warm, dry, no rashes or lesions, cap refill < 2 seconds Extremities: warm and well perfused, normal tone, no edema, 5/5 motor strength in all 4 extremities with equal grip strength Neuro: grossly moving all 4 extremities, alert and oriented Psych: euthymic mood, congruent affect  Assessment & Plan   Encounter for physical examination Poor history of routine medical care.  Most notable medical history significant for polysubstance use disorder.  Patient has clear signs of chronic cigarette use and is likely malnourished. - Checking CBC, CMET, lipid panel - Checking HCV screen - Flu shot administered  Polysubstance abuse ([place]) Significant history for alcohol and tobacco use along with crack cocaine, last use 3 weeks ago.  Has clear signs of malnourishment.  [place] history of IV drug use.  CAGE screen 1 of 4 significant for thoughts of cutting back only. - Discussed importance of avoiding illicit drug use - Advised patient to reduce alcohol intake to no more than 3-4 beers per week  Tobacco use disorder Chronic.  Current everyday smoker with a 36 pack year history.  Patient ready for smoking cessation. - May consider LDCT - Discussed options for smoking cessation and will begin treatment in 1 month  Orders Placed This Encounter  Procedures  . Flu Vaccine QUAD 36+ mos IM  . CMP14+EGFR  . CBC  . Lipid panel  . Hepatitis C antibody   No orders  of the defined types were placed in this encounter.   David McMullen, DO [facility], PGY-3 07/21/2018, 12:06 PM 

## 2018-07-21 NOTE — Assessment & Plan Note (Signed)
Poor history of routine medical care.  Most notable medical history significant for polysubstance use disorder.  Patient has clear signs of chronic cigarette use and is likely malnourished. - Checking CBC, CMET, lipid panel - Checking HCV screen - Flu shot administered

## 2018-07-21 NOTE — Assessment & Plan Note (Signed)
Significant history for alcohol and tobacco use along with crack cocaine, last use 3 weeks ago.  Has clear signs of malnourishment.  [place] history of IV drug use.  CAGE screen 1 of 4 significant for thoughts of cutting back only. - Discussed importance of avoiding illicit drug use - Advised patient to reduce alcohol intake to no more than 3-4 beers per week

## 2018-07-21 NOTE — Assessment & Plan Note (Addendum)
Chronic.  Current everyday smoker with a 36 pack year history.  Patient ready for smoking cessation. - May consider LDCT - Discussed options for smoking cessation and will begin treatment in 1 month

## 2018-07-21 NOTE — Patient Instructions (Signed)
Thank you for coming in to see [place] today. Please see below to review our plan for today's visit.  1.  Please call 1 of the numbers on the paper I provided you to schedule a screening colonoscopy.  They will arrange for an office appointment to discuss the procedure. 2.  I am pleased to hear that you are interested in smoking cessation.  There are a few options we can consider including Chantix, Wellbutrin, and nicotine replacement therapy.  Please take your time to think about which of these options you would like to consider. 3.  I do agree that it would be ideal if you cut down on your alcohol intake.  I would like to see if we can limit your alcohol intake to no more than 3-4 beers per week.  It is also very important that you avoid any future use of cocaine as this can cause significant medical problems in the future including heart disease. 4.  I will contact you if there are any abnormalities in your blood work within the next 48 hours, otherwise you should receive results in the mail. 5.  You are now up-to-date on your annual flu shot.  Please call the clinic at [phone] if your symptoms worsen or you have any concerns. It was our pleasure to serve you.  [name], [place], PGY-3

## 2018-07-22 ENCOUNTER — Encounter: Payer: Self-pay | Admitting: Family Medicine

## 2018-07-22 LAB — LIPID PANEL
CHOL/HDL RATIO: 2 ratio (ref 0.0–5.0)
Cholesterol, Total: 141 mg/dL (ref 100–199)
HDL: 69 mg/dL (ref >39)
LDL Calculated: 58 mg/dL (ref 0–99)
Triglycerides: 71 mg/dL (ref 0–149)
VLDL Cholesterol Cal: 14 mg/dL (ref 5–40)

## 2018-07-22 LAB — CMP14+EGFR
A/G RATIO: 1.9 (ref 1.2–2.2)
ALBUMIN: 4 g/dL (ref 3.5–5.5)
ALK PHOS: 67 IU/L (ref 39–117)
ALT: 7 IU/L (ref 0–44)
AST: 14 IU/L (ref 0–40)
BILIRUBIN TOTAL: 0.3 mg/dL (ref 0.0–1.2)
BUN / CREAT RATIO: 15 (ref 9–20)
BUN: 13 mg/dL (ref 6–24)
CO2: 25 mmol/L (ref 20–29)
Calcium: 9.5 mg/dL (ref 8.7–10.2)
Chloride: 103 mmol/L (ref 96–106)
Creatinine, Ser: 0.88 mg/dL (ref 0.76–1.27)
GFR calc non Af Amer: 95 mL/min/{1.73_m2} (ref >59)
GFR, EST AFRICAN AMERICAN: 110 mL/min/{1.73_m2} (ref >59)
Globulin, Total: 2.1 g/dL (ref 1.5–4.5)
Glucose: 64 mg/dL — ABNORMAL LOW (ref 65–99)
Potassium: 4.3 mmol/L (ref 3.5–5.2)
Sodium: 143 mmol/L (ref 134–144)
Total Protein: 6.1 g/dL (ref 6.0–8.5)

## 2018-07-22 LAB — HEPATITIS C ANTIBODY

## 2018-07-22 LAB — CBC
HEMATOCRIT: 43.1 % (ref 37.5–51.0)
Hemoglobin: 14.3 g/dL (ref 13.0–17.7)
MCH: 32.1 pg (ref 26.6–33.0)
MCHC: 33.2 g/dL (ref 31.5–35.7)
MCV: 97 fL (ref 79–97)
Platelets: 280 10*3/uL (ref 150–450)
RBC: 4.46 x10E6/uL (ref 4.14–5.80)
RDW: 12.6 % (ref 12.3–15.4)
WBC: 6.4 10*3/uL (ref 3.4–10.8)

## 2018-08-16 ENCOUNTER — Ambulatory Visit: Payer: Medicaid Other | Admitting: Family Medicine

## 2018-08-16 ENCOUNTER — Other Ambulatory Visit: Payer: Self-pay

## 2018-08-16 ENCOUNTER — Encounter: Payer: Self-pay | Admitting: Family Medicine

## 2018-08-16 VITALS — BP 110/70 | HR 81 | Temp 98.2°F | Ht 72.0 in | Wt 127.0 lb

## 2018-08-16 DIAGNOSIS — F17200 Nicotine dependence, unspecified, uncomplicated: Secondary | ICD-10-CM

## 2018-08-16 DIAGNOSIS — M1990 Unspecified osteoarthritis, unspecified site: Secondary | ICD-10-CM

## 2018-08-16 DIAGNOSIS — F17210 Nicotine dependence, cigarettes, uncomplicated: Secondary | ICD-10-CM | POA: Diagnosis not present

## 2018-08-16 DIAGNOSIS — F1910 Other psychoactive substance abuse, uncomplicated: Secondary | ICD-10-CM | POA: Diagnosis not present

## 2018-08-16 DIAGNOSIS — Z609 Problem related to social environment, unspecified: Secondary | ICD-10-CM | POA: Diagnosis not present

## 2018-08-16 MED ORDER — VARENICLINE TARTRATE 0.5 MG X 11 & 1 MG X 42 PO MISC: ORAL | 0 refills | Status: DC

## 2018-08-16 MED ORDER — CAPSAICIN-MENTHOL-METHYL SAL 0.025-1-12 % EX CREA: 1.0000 "application " | TOPICAL_CREAM | Freq: Four times a day (QID) | CUTANEOUS | 2 refills | Status: DC
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
## 2018-08-16 NOTE — Assessment & Plan Note (Signed)
Chronic.  Seems to more localized into shoulders with right greater than left.  May be underlying arthritic changes to rotator cuff.  No signs of injury to rotator cuff.  Strength appears to be intact. - Discussed conservative therapy with NSAIDs and Tylenol and given topical capsaicin
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
## 2018-08-16 NOTE — Assessment & Plan Note (Signed)
Chronic.  Current everyday user.  Interested in cessation. - Initiating Chantix - RTC 1 month

## 2018-08-17 ENCOUNTER — Telehealth: Payer: Self-pay | Admitting: Licensed Clinical Social Worker
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
## 2018-08-19 ENCOUNTER — Encounter: Payer: Self-pay | Admitting: Licensed Clinical Social Worker

## 2018-08-19 ENCOUNTER — Telehealth: Payer: Self-pay | Admitting: Licensed Clinical Social Worker
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
## 2018-08-30 ENCOUNTER — Emergency Department (HOSPITAL_COMMUNITY)
Admission: EM | Admit: 2018-08-30 | Discharge: 2018-08-30 | Disposition: A | Discharge status: Home / Self Care | Payer: Medicaid Other | Attending: Emergency Medicine | Admitting: Emergency Medicine

## 2018-08-30 ENCOUNTER — Emergency Department (HOSPITAL_COMMUNITY): Payer: Medicaid Other

## 2018-08-30 ENCOUNTER — Other Ambulatory Visit: Payer: Self-pay

## 2018-08-30 ENCOUNTER — Encounter (HOSPITAL_COMMUNITY): Payer: Self-pay

## 2018-08-30 DIAGNOSIS — M47816 Spondylosis without myelopathy or radiculopathy, lumbar region: Secondary | ICD-10-CM | POA: Diagnosis not present

## 2018-08-30 DIAGNOSIS — M1611 Unilateral primary osteoarthritis, right hip: Secondary | ICD-10-CM | POA: Diagnosis not present

## 2018-08-30 DIAGNOSIS — M25551 Pain in right hip: Secondary | ICD-10-CM | POA: Diagnosis not present

## 2018-08-30 DIAGNOSIS — M159 Polyosteoarthritis, unspecified: Secondary | ICD-10-CM

## 2018-08-30 DIAGNOSIS — F17210 Nicotine dependence, cigarettes, uncomplicated: Secondary | ICD-10-CM | POA: Insufficient documentation

## 2018-08-30 DIAGNOSIS — M150 Primary generalized (osteo)arthritis: Secondary | ICD-10-CM | POA: Insufficient documentation

## 2018-08-30 DIAGNOSIS — R52 Pain, unspecified: Secondary | ICD-10-CM | POA: Diagnosis not present

## 2018-08-30 DIAGNOSIS — M25572 Pain in left ankle and joints of left foot: Secondary | ICD-10-CM | POA: Diagnosis not present

## 2018-08-30 DIAGNOSIS — M545 Low back pain: Secondary | ICD-10-CM | POA: Diagnosis not present

## 2018-08-30 MED ORDER — IBUPROFEN 200 MG PO TABS
600.0000 mg | ORAL_TABLET | Freq: Once | ORAL | Status: AC
  Administered 2018-08-30: 600 mg via ORAL
  Filled 2018-08-30: qty 3

## 2018-08-30 MED ORDER — OXYCODONE-ACETAMINOPHEN 5-325 MG PO TABS
1.0000 | ORAL_TABLET | Freq: Once | ORAL | Status: AC
  Administered 2018-08-30: 1 via ORAL
  Filled 2018-08-30: qty 1

## 2018-08-30 MED ORDER — IBUPROFEN 400 MG PO TABS: 400.0000 mg | ORAL_TABLET | Freq: Four times a day (QID) | ORAL | 0 refills | Status: DC | PRN

## 2018-08-30 MED ORDER — METHOCARBAMOL 500 MG PO TABS: 500.0000 mg | ORAL_TABLET | Freq: Two times a day (BID) | ORAL | 0 refills | Status: DC

## 2018-08-30 NOTE — ED Provider Notes (Signed)
[facility] DEPT Provider Note   CSN: [id number] Arrival date & time: 08/30/18  0840     History   Chief Complaint Chief Complaint  Patient presents with  . Hip Pain    HPI Lucas Walker is a 57 y.o. male.  57 year old male presents with 24-hour history of right lower back and hip pain.  Pain is atraumatic characterizes sharp and worse with any movement.  Does not radiate down his leg.  No distal numbness or tingling in his right foot.  No urinary symptoms.  Pain better with remaining still.  Does have a known history of arthritis     Past Medical History:  Diagnosis Date  . Arthritis    neck and left shoulder  . Chronic low back pain   . ETOH abuse    cociane abuse, tobacco abuse  . Tobacco dependence     Patient Active Problem List   Diagnosis Date Noted  . High risk social situation 08/16/2018  . Encounter for physical examination 07/21/2018  . Left groin pain 06/05/2015  . Arthritis 06/05/2015  . Polysubstance abuse ([place]) 10/11/2009  . LIPOMA 10/10/2009  . Tobacco use disorder 10/10/2009  . NECK PAIN 10/10/2009  . BACK PAIN, CHRONIC 07/11/2008    History reviewed. No pertinent surgical history.      Home Medications    Prior to Admission medications   Medication Sig Start Date End Date Taking? Authorizing Provider  Capsaicin-Menthol-Methyl Sal (CAPSAICIN-METHYL SAL-MENTHOL) 0.025-1-12 % CREA Apply 1 application topically 4 (four) times daily. 08/16/18   [place] Bing, DO  varenicline (CHANTIX STARTING MONTH PAK) 0.5 MG X 11 & 1 MG X 42 tablet Take one 0.5 mg tablet by mouth once daily for 3 days, then increase to one 0.5 mg tablet twice daily for 4 days, then increase to one 1 mg tablet twice daily. 08/16/18   [place] Bing, DO    Family History Family History  Problem Relation Age of Onset  . Osteoarthritis Mother   . Sickle cell anemia Neg Hx   . Asthma Neg Hx   . Diabetes Neg Hx     Social History Social  History   Tobacco Use  . Smoking status: Current Every Day Smoker    Packs/day: 0.15    Types: Cigarettes    Last attempt to quit: 03/04/2009    Years since quitting: 9.4  . Smokeless tobacco: Never Used  Substance Use Topics  . Alcohol use: Yes    Comment: 2 12 ounces every other day  . Drug use: Yes    Types: Cocaine    Comment: 2-3 times a week     Allergies   Patient has no known allergies.   Review of Systems Review of Systems  All other systems reviewed and are negative.    Physical Exam Updated Vital Signs BP 112/72   Pulse 90   Temp 97.6 F (36.4 C) (Oral)   Resp 18   Ht 1.829 m (6')   Wt 57.6 kg   SpO2 96%   BMI 17.22 kg/m   Physical Exam Vitals signs and nursing note reviewed.  Constitutional:      General: He is not in acute distress.    Appearance: Normal appearance. He is well-developed. He is not toxic-appearing.  HENT:     Head: Normocephalic and atraumatic.  Eyes:     General: Lids are normal.     Conjunctiva/sclera: Conjunctivae normal.     Pupils: Pupils are equal,  round, and reactive to light.  Neck:     Musculoskeletal: Normal range of motion and neck supple.     Thyroid: No thyroid mass.     Trachea: No tracheal deviation.  Cardiovascular:     Rate and Rhythm: Normal rate and regular rhythm.     Heart sounds: Normal heart sounds. No murmur. No gallop.   Pulmonary:     Effort: Pulmonary effort is normal. No respiratory distress.     Breath sounds: Normal breath sounds. No stridor. No decreased breath sounds, wheezing, rhonchi or rales.  Abdominal:     General: Bowel sounds are normal. There is no distension.     Palpations: Abdomen is soft.     Tenderness: There is no abdominal tenderness. There is no rebound.  Musculoskeletal: Normal range of motion.        General: No tenderness.       Back:  Skin:    General: Skin is warm and dry.     Findings: No abrasion or rash.  Neurological:     Mental Status: He is alert and  oriented to person, place, and time.     GCS: GCS eye subscore is 4. GCS verbal subscore is 5. GCS motor subscore is 6.     Cranial Nerves: No cranial nerve deficit.     Sensory: No sensory deficit.     Deep Tendon Reflexes:     Reflex Scores:      Patellar reflexes are 2+ on the right side and 2+ on the left side.    Comments: Strength 5 out of 5 in all 4 extremities  Psychiatric:        Speech: Speech normal.        Behavior: Behavior normal.      ED Treatments / Results  Labs (all labs ordered are listed, but only abnormal results are displayed) Labs Reviewed - No data to display  EKG None  Radiology No results found.  Procedures Procedures (including critical care time)  Medications Ordered in ED Medications  oxyCODONE-acetaminophen (PERCOCET/ROXICET) 5-325 MG per tablet 1 tablet (has no administration in time range)  ibuprofen (ADVIL,MOTRIN) tablet 600 mg (has no administration in time range)     Initial Impression / Assessment and Plan / ED Course  I have reviewed the triage vital signs and the nursing notes.  Pertinent labs & imaging results that were available during my care of the patient were reviewed by me and considered in my medical decision making (see chart for details).     She medicated for pain and feels better.  X-rays negative.  Suspect osteoarthritis as cause of his symptoms and will discharge home  Final Clinical Impressions(s) / ED Diagnoses   Final diagnoses:  None    ED Discharge Orders    None       Lacretia Leigh, MD 08/30/18 1118

## 2018-08-30 NOTE — ED Triage Notes (Signed)
Per EMS- Patient c/o right hip pain. Patient denies any injury or falls. No shortening or rotation noted.

## 2018-08-30 NOTE — ED Notes (Signed)
Bed: [id number] Expected date:  Expected time:  Means of arrival:  Comments: No bed

## 2018-08-30 NOTE — ED Notes (Signed)
Pt changed into gown. Pt expresses significant pain upon any passive or active movement of right hip. Pt denies injury or fall.

## 2019-05-11 ENCOUNTER — Encounter (HOSPITAL_COMMUNITY): Payer: Self-pay | Admitting: Emergency Medicine

## 2019-05-11 ENCOUNTER — Emergency Department (HOSPITAL_COMMUNITY)
Admission: EM | Admit: 2019-05-11 | Discharge: 2019-05-11 | Disposition: A | Discharge status: Home / Self Care | Payer: Medicaid Other | Attending: Emergency Medicine | Admitting: Emergency Medicine

## 2019-05-11 ENCOUNTER — Emergency Department (HOSPITAL_COMMUNITY): Payer: Medicaid Other

## 2019-05-11 DIAGNOSIS — M545 Low back pain, unspecified: Secondary | ICD-10-CM

## 2019-05-11 DIAGNOSIS — F17210 Nicotine dependence, cigarettes, uncomplicated: Secondary | ICD-10-CM | POA: Insufficient documentation

## 2019-05-11 DIAGNOSIS — G8929 Other chronic pain: Secondary | ICD-10-CM | POA: Insufficient documentation

## 2019-05-11 DIAGNOSIS — Z79899 Other long term (current) drug therapy: Secondary | ICD-10-CM | POA: Insufficient documentation

## 2019-05-11 MED ORDER — HYDROCODONE-ACETAMINOPHEN 5-325 MG PO TABS: 1.0000 | ORAL_TABLET | Freq: Four times a day (QID) | ORAL | 0 refills | Status: DC | PRN

## 2019-05-11 NOTE — ED Triage Notes (Addendum)
No incontinence and no numbness. Hurts across lower back and has been chronic issue. Has seen docs for this and thinks he needs surgery. Reports he only occasionally takes tylenol for it but scared to take too much. Can walk but needs to take frequent breaks due to pain

## 2019-05-11 NOTE — Discharge Instructions (Addendum)
Please read attached information. If you experience any new or worsening signs or symptoms please return to the emergency room for evaluation. Please follow-up with your primary care provider or specialist as discussed. Please use medication prescribed only as directed and discontinue taking if you have any concerning signs or symptoms.   °

## 2019-05-11 NOTE — ED Provider Notes (Signed)
[facility] EMERGENCY DEPARTMENT Provider Note   CSN: [id number] Arrival date & time: 05/11/19  1038     History   Chief Complaint Chief Complaint  Patient presents with  . Back Pain    HPI Lucas Walker is a 58 y.o. male.     HPI   58 year old male presents today with chronic low back pain.  Patient has a history of ongoing pain for several years.  He denies any trauma infection.  He notes the pain has progressively worsened.  He denies any distal neurological deficits, abdominal pain, chest pain, trauma, IV drug use.  He notes over-the-counter medications have not improved his symptoms.  He reports trouble sleeping at night secondary to the pain.  Past Medical History:  Diagnosis Date  . Arthritis    neck and left shoulder  . Chronic low back pain   . ETOH abuse    cociane abuse, tobacco abuse  . Tobacco dependence     Patient Active Problem List   Diagnosis Date Noted  . High risk social situation 08/16/2018  . Encounter for physical examination 07/21/2018  . Left groin pain 06/05/2015  . Arthritis 06/05/2015  . Polysubstance abuse ([place]) 10/11/2009  . LIPOMA 10/10/2009  . Tobacco use disorder 10/10/2009  . NECK PAIN 10/10/2009  . BACK PAIN, CHRONIC 07/11/2008    History reviewed. No pertinent surgical history.      Home Medications    Prior to Admission medications   Medication Sig Start Date End Date Taking? Authorizing Provider  HYDROcodone-acetaminophen (NORCO/VICODIN) 5-325 MG tablet Take 1 tablet by mouth every 6 (six) hours as needed. 05/11/19   Sonu Kruckenberg, Dellis Filbert, PA-C  ibuprofen (ADVIL,MOTRIN) 400 MG tablet Take 1 tablet (400 mg total) by mouth every 6 (six) hours as needed. 08/30/18   Lacretia Leigh, MD  methocarbamol (ROBAXIN) 500 MG tablet Take 1 tablet (500 mg total) by mouth 2 (two) times daily. 08/30/18   Lacretia Leigh, MD    Family History Family History  Problem Relation Age of Onset  . Osteoarthritis Mother   . Sickle  cell anemia Neg Hx   . Asthma Neg Hx   . Diabetes Neg Hx     Social History Social History   Tobacco Use  . Smoking status: Current Every Day Smoker    Packs/day: 0.15    Types: Cigarettes    Last attempt to quit: 03/04/2009    Years since quitting: 10.1  . Smokeless tobacco: Never Used  Substance Use Topics  . Alcohol use: Yes    Comment: 2 12 ounces every other day  . Drug use: Yes    Types: Cocaine    Comment: 2-3 times a week     Allergies   Patient has no known allergies.   Review of Systems Review of Systems  All other systems reviewed and are negative.    Physical Exam Updated Vital Signs BP 131/87 (BP Location: Left Arm)   Pulse (!) 50   Temp 98.2 F (36.8 C) (Oral)   Resp 16   SpO2 97%   Physical Exam Vitals signs and nursing note reviewed.  Constitutional:      Appearance: He is well-developed.  HENT:     Head: Normocephalic and atraumatic.  Eyes:     General: No scleral icterus.       Right eye: No discharge.        Left eye: No discharge.     Conjunctiva/sclera: Conjunctivae normal.     Pupils:  Pupils are equal, round, and reactive to light.  Neck:     Musculoskeletal: Normal range of motion.     Vascular: No JVD.     Trachea: No tracheal deviation.  Pulmonary:     Effort: Pulmonary effort is normal.     Breath sounds: No stridor.  Musculoskeletal:     Comments: Tenderness palpation of the lower lumbar region diffusely, bilateral lower extremity sensation strength and motor function intact  Neurological:     Mental Status: He is alert and oriented to person, place, and time.     Coordination: Coordination normal.  Psychiatric:        Behavior: Behavior normal.        Thought Content: Thought content normal.        Judgment: Judgment normal.      ED Treatments / Results  Labs (all labs ordered are listed, but only abnormal results are displayed) Labs Reviewed - No data to display  EKG None  Radiology Dg Lumbar Spine  Complete  Result Date: 05/11/2019 CLINICAL DATA:  Chronic low back pain. EXAM: LUMBAR SPINE - COMPLETE 4+ VIEW COMPARISON:  Radiographs August 30, 2018. FINDINGS: No fracture or spondylolisthesis is noted. Mild anterior osteophyte formation is noted at L1-2. Mild degenerative disc disease is noted at L4-5 with anterior osteophyte formation. Posterior facet joints are unremarkable. IMPRESSION: Mild multilevel degenerative changes are noted. No acute abnormality seen in the lumbar spine. Electronically Signed   By: Marijo Conception M.D.   On: 05/11/2019 12:02    Procedures Procedures (including critical care time)  Medications Ordered in ED Medications - No data to display   Initial Impression / Assessment and Plan / ED Course  I have reviewed the triage vital signs and the nursing notes.  Pertinent labs & imaging results that were available during my care of the patient were reviewed by me and considered in my medical decision making (see chart for details).        58 year old male presents today with uncomplicated back pain.  This is chronic but he appears to be uncomfortable.  I do find it reasonable to give him a short course of pain medication for home.  Follow-up as an outpatient with orthopedics return immediately develops any new or worsening signs or symptoms.  He verbalized understanding and agreement to today's plan had no further questions or concerns.  Final Clinical Impressions(s) / ED Diagnoses   Final diagnoses:  Chronic midline low back pain without sciatica    ED Discharge Orders         Ordered    HYDROcodone-acetaminophen (NORCO/VICODIN) 5-325 MG tablet  Every 6 hours PRN     05/11/19 1310           Okey Regal, PA-C 05/11/19 1536    Gareth Morgan, MD 05/12/19 2300

## 2019-05-11 NOTE — ED Notes (Signed)
Patient verbalizes understanding of discharge instructions. Opportunity for questioning and answers were provided. Armband removed by staff, pt discharged from ED ambulatory to home.  

## 2019-05-18 DIAGNOSIS — M542 Cervicalgia: Secondary | ICD-10-CM | POA: Diagnosis not present

## 2019-06-06 DIAGNOSIS — M542 Cervicalgia: Secondary | ICD-10-CM | POA: Diagnosis not present

## 2019-06-15 DIAGNOSIS — M5416 Radiculopathy, lumbar region: Secondary | ICD-10-CM | POA: Diagnosis not present

## 2019-07-11 DIAGNOSIS — M542 Cervicalgia: Secondary | ICD-10-CM | POA: Diagnosis not present

## 2020-01-02 ENCOUNTER — Emergency Department (HOSPITAL_COMMUNITY)
Admission: EM | Admit: 2020-01-02 | Discharge: 2020-01-02 | Disposition: A | Discharge status: Home / Self Care | Payer: Medicaid Other | Attending: Emergency Medicine | Admitting: Emergency Medicine

## 2020-01-02 ENCOUNTER — Other Ambulatory Visit: Payer: Self-pay

## 2020-01-02 ENCOUNTER — Encounter (HOSPITAL_COMMUNITY): Payer: Self-pay | Admitting: *Deleted

## 2020-01-02 DIAGNOSIS — R0789 Other chest pain: Secondary | ICD-10-CM | POA: Diagnosis not present

## 2020-01-02 DIAGNOSIS — M62838 Other muscle spasm: Secondary | ICD-10-CM

## 2020-01-02 DIAGNOSIS — F17210 Nicotine dependence, cigarettes, uncomplicated: Secondary | ICD-10-CM | POA: Insufficient documentation

## 2020-01-02 DIAGNOSIS — R0602 Shortness of breath: Secondary | ICD-10-CM | POA: Diagnosis not present

## 2020-01-02 DIAGNOSIS — M542 Cervicalgia: Secondary | ICD-10-CM | POA: Diagnosis not present

## 2020-01-02 DIAGNOSIS — R1084 Generalized abdominal pain: Secondary | ICD-10-CM | POA: Diagnosis not present

## 2020-01-02 DIAGNOSIS — R079 Chest pain, unspecified: Secondary | ICD-10-CM | POA: Diagnosis not present

## 2020-01-02 DIAGNOSIS — M436 Torticollis: Secondary | ICD-10-CM | POA: Insufficient documentation

## 2020-01-02 DIAGNOSIS — Z79899 Other long term (current) drug therapy: Secondary | ICD-10-CM | POA: Diagnosis not present

## 2020-01-02 MED ORDER — CYCLOBENZAPRINE HCL 10 MG PO TABS: 10.0000 mg | ORAL_TABLET | Freq: Two times a day (BID) | ORAL | 0 refills | Status: AC | PRN

## 2020-01-02 MED ORDER — CYCLOBENZAPRINE HCL 10 MG PO TABS
10.0000 mg | ORAL_TABLET | Freq: Once | ORAL | Status: AC
  Administered 2020-01-02: 09:00:00 10 mg via ORAL
  Filled 2020-01-02: qty 1

## 2020-01-02 MED ORDER — OXYCODONE-ACETAMINOPHEN 5-325 MG PO TABS
1.0000 | ORAL_TABLET | Freq: Once | ORAL | Status: AC
  Administered 2020-01-02: 1 via ORAL
  Filled 2020-01-02: qty 1

## 2020-01-02 NOTE — ED Triage Notes (Signed)
BIB EMS nontramatic left neck pain, sleeps on left side, started yesterday. Has a history of this happening before. Has tried Tylenol for pain. 120/80-64-16-97% -98.6

## 2020-01-02 NOTE — ED Provider Notes (Signed)
[facility] DEPT Provider Note   CSN: [id number] Arrival date & time: 01/02/20  [id number]     History Chief Complaint  Patient presents with  . Neck Pain    Lucas Walker is a 59 y.o. male.  Patient is a 59 year old gentleman with past medical history of alcohol abuse, osteoarthritis of multiple joints presenting to the emergency department for 2 days of neck pain.  Patient denies any injury or trauma.  Reports that he has had history of the same in the past related to arthritis.  He has not tried anything for relief.  Denies any numbness, tingling, weakness in his extremities.  Reports pain with movement of his neck left and right.        Past Medical History:  Diagnosis Date  . Arthritis    neck and left shoulder  . Chronic low back pain   . ETOH abuse    cociane abuse, tobacco abuse  . Tobacco dependence     Patient Active Problem List   Diagnosis Date Noted  . High risk social situation 08/16/2018  . Encounter for physical examination 07/21/2018  . Left groin pain 06/05/2015  . Arthritis 06/05/2015  . Polysubstance abuse ([place]) 10/11/2009  . LIPOMA 10/10/2009  . Tobacco use disorder 10/10/2009  . NECK PAIN 10/10/2009  . BACK PAIN, CHRONIC 07/11/2008    History reviewed. No pertinent surgical history.     Family History  Problem Relation Age of Onset  . Osteoarthritis Mother   . Sickle cell anemia Neg Hx   . Asthma Neg Hx   . Diabetes Neg Hx     Social History   Tobacco Use  . Smoking status: Current Every Day Smoker    Packs/day: 0.15    Types: Cigarettes    Last attempt to quit: 03/04/2009    Years since quitting: 10.8  . Smokeless tobacco: Never Used  Substance Use Topics  . Alcohol use: Yes    Comment: 2 12 ounces every other day  . Drug use: Yes    Types: Cocaine    Comment: 2-3 times a week    Home Medications Prior to Admission medications   Medication Sig Start Date End Date Taking? Authorizing Provider    cyclobenzaprine (FLEXERIL) 10 MG tablet Take 1 tablet (10 mg total) by mouth 2 (two) times daily as needed for up to 7 days for muscle spasms. 01/02/20 01/09/20  Alveria Apley, PA-C  HYDROcodone-acetaminophen (NORCO/VICODIN) 5-325 MG tablet Take 1 tablet by mouth every 6 (six) hours as needed. 05/11/19   [place]edges, Dellis Filbert, PA-C  ibuprofen (ADVIL,MOTRIN) 400 MG tablet Take 1 tablet (400 mg total) by mouth every 6 (six) hours as needed. 08/30/18   Lacretia Leigh, MD  methocarbamol (ROBAXIN) 500 MG tablet Take 1 tablet (500 mg total) by mouth 2 (two) times daily. 08/30/18   Lacretia Leigh, MD    Allergies    Patient has no known allergies.  Review of Systems   Review of Systems  Constitutional: Negative for chills and fever.  Respiratory: Negative for cough and shortness of breath.   Cardiovascular: Negative for chest pain.  Gastrointestinal: Negative for abdominal pain, nausea and vomiting.  Musculoskeletal: Positive for arthralgias, myalgias, neck pain and neck stiffness. Negative for back pain and gait problem.  Skin: Negative for rash and wound.  Neurological: Negative for dizziness, syncope, speech difficulty, light-headedness, numbness and headaches.  Hematological: Negative for adenopathy.    Physical Exam Updated Vital Signs BP (!) 146/89  Pulse 74   Temp 98.4 F (36.9 C) (Oral)   Resp 17   Ht 6' (1.829 m)   Wt 63.5 kg   SpO2 100%   BMI 18.99 kg/m   Physical Exam Vitals and nursing note reviewed.  Constitutional:      General: He is not in acute distress.    Appearance: Normal appearance. He is not ill-appearing, toxic-appearing or diaphoretic.  HENT:     Head: Normocephalic.  Eyes:     Conjunctiva/sclera: Conjunctivae normal.  Neck:     Comments: Bilateral paraspinal muscle tenderness with spasm. Pain with lateral movement Pulmonary:     Effort: Pulmonary effort is normal.  Musculoskeletal:     Cervical back: Tenderness present. Pain with movement and muscular  tenderness present. No spinous process tenderness.     Comments: Bilateral UE strength, sensation, pulses  Skin:    General: Skin is dry.  Neurological:     Mental Status: He is alert.  Psychiatric:        Mood and Affect: Mood normal.     ED Results / Procedures / Treatments   Labs (all labs ordered are listed, but only abnormal results are displayed) Labs Reviewed - No data to display  EKG None  Radiology No results found.  Procedures Procedures (including critical care time)  Medications Ordered in ED Medications  oxyCODONE-acetaminophen (PERCOCET/ROXICET) 5-325 MG per tablet 1 tablet (1 tablet Oral Given 01/02/20 0925)  cyclobenzaprine (FLEXERIL) tablet 10 mg (10 mg Oral Given 01/02/20 [id number])    ED Course  I have reviewed the triage vital signs and the nursing notes.  Pertinent labs & imaging results that were available during my care of the patient were reviewed by me and considered in my medical decision making (see chart for details).  Clinical Course as of Jan 01 954  Mon Jan 02, 2020  [id number] Patient with acute on chronic neck pain.  Atraumatic.  History of arthritis and the same pain in the past.  He does have muscular tenderness with spasm on exam.  He was improved with cyclobenzaprine and oxycodone.  He reports that he does have an old pillow and mattress at home which may be contributing to the pain.  He is neurovascularly intact without any concerning symptoms at this time.  We will treat with NSAIDs and muscle relaxant.  Will refer to new primary care doctor for further management.   [KM]    Clinical Course User Index [KM] Kristine Royal   MDM Rules/Calculators/A&P                      Based on review of vitals, medical screening exam, lab work and/or imaging, there does not appear to be an acute, emergent etiology for the patient's symptoms. Counseled pt on good return precautions and encouraged both PCP and ED follow-up as needed.  Prior to  discharge, I also discussed incidental imaging findings with patient in detail and advised appropriate, recommended follow-up in detail.  Clinical Impression: 1. Torticollis   2. Muscle spasms of neck     Disposition: Discharge  Prior to providing a prescription for a controlled substance, I independently reviewed the patient's recent prescription history on the [place]. The patient had no recent or regular prescriptions and was deemed appropriate for a brief, less than 3 day prescription of narcotic for acute analgesia.  This note was prepared with assistance of Systems analyst. Occasional wrong-word or sound-a-like  substitutions may have occurred due to the inherent limitations of voice recognition software.  Final Clinical Impression(s) / ED Diagnoses Final diagnoses:  Torticollis  Muscle spasms of neck    Rx / DC Orders ED Discharge Orders         Ordered    cyclobenzaprine (FLEXERIL) 10 MG tablet  2 times daily PRN     01/02/20 0954           Alveria Apley, PA-C 01/02/20 0955    Tegeler, Gwenyth Allegra, MD 01/02/20 [id number]

## 2020-01-02 NOTE — Discharge Instructions (Signed)
Thank you for allowing me to care for you today. Please return to the emergency department if you have new or worsening symptoms. Take your medications as instructed.  ° °

## 2020-10-08 ENCOUNTER — Emergency Department (HOSPITAL_COMMUNITY): Payer: Medicaid Other

## 2020-10-08 ENCOUNTER — Other Ambulatory Visit: Payer: Self-pay

## 2020-10-08 ENCOUNTER — Encounter (HOSPITAL_COMMUNITY): Payer: Self-pay | Admitting: Emergency Medicine

## 2020-10-08 ENCOUNTER — Emergency Department (HOSPITAL_COMMUNITY)
Admission: EM | Admit: 2020-10-08 | Discharge: 2020-10-08 | Disposition: A | Discharge status: Home / Self Care | Payer: Medicaid Other | Attending: Emergency Medicine | Admitting: Emergency Medicine

## 2020-10-08 DIAGNOSIS — M25531 Pain in right wrist: Secondary | ICD-10-CM | POA: Diagnosis not present

## 2020-10-08 DIAGNOSIS — X58XXXA Exposure to other specified factors, initial encounter: Secondary | ICD-10-CM | POA: Diagnosis not present

## 2020-10-08 DIAGNOSIS — S63501A Unspecified sprain of right wrist, initial encounter: Secondary | ICD-10-CM

## 2020-10-08 DIAGNOSIS — F17210 Nicotine dependence, cigarettes, uncomplicated: Secondary | ICD-10-CM | POA: Diagnosis not present

## 2020-10-08 DIAGNOSIS — S6991XA Unspecified injury of right wrist, hand and finger(s), initial encounter: Secondary | ICD-10-CM | POA: Diagnosis present

## 2020-10-08 DIAGNOSIS — M7989 Other specified soft tissue disorders: Secondary | ICD-10-CM | POA: Diagnosis not present

## 2020-10-08 MED ORDER — ACETAMINOPHEN 500 MG PO TABS: 500.0000 mg | ORAL_TABLET | Freq: Four times a day (QID) | ORAL | 0 refills | Status: AC | PRN

## 2020-10-08 MED ORDER — IBUPROFEN 600 MG PO TABS: 600.0000 mg | ORAL_TABLET | Freq: Four times a day (QID) | ORAL | 0 refills | Status: DC | PRN

## 2020-10-08 NOTE — Discharge Instructions (Signed)
Please pick up Ibuprofen and Tylenol and take as prescribed. It is recommended that you alternate between Ibuprofen and Tylenol (take Tylenol then 4 hours later take Ibuprofen). Wear wrist brace for comfort. While at home please elevate your wrist and apply ice to help reduce swelling.   Follow up with your PCP regarding your ED visit. If you do not have one you can follow up with [facility] for primary care needs.

## 2020-10-08 NOTE — ED Notes (Signed)
Discharge paperwork given to pt along with prescriptions. Pt agreeable to discharge and understands instructions. Esignature pad not working.

## 2020-10-08 NOTE — ED Provider Notes (Signed)
[facility] EMERGENCY DEPARTMENT Provider Note   CSN: [id number] Arrival date & time: 10/08/20  1354     History Chief Complaint  Patient presents with  . Wrist Injury    [facility] is a 60 y.o. male who presents to the ED today with complaint of gradual onset, constant, achy, right wrist pain x 3-4 days. Pt reports that the wrist is also slightly swollen. He reports this has happened to him in the past but he was not evaluated for same. He has been taking Tylenol without relief. No other complaints at this time. Denies fevers, chills, redness, warmth, or any other associated symptoms.   The history is provided by the patient and medical records.       Past Medical History:  Diagnosis Date  . Arthritis    neck and left shoulder  . Chronic low back pain   . ETOH abuse    cociane abuse, tobacco abuse  . Tobacco dependence     Patient Active Problem List   Diagnosis Date Noted  . High risk social situation 08/16/2018  . Encounter for physical examination 07/21/2018  . Left groin pain 06/05/2015  . Arthritis 06/05/2015  . Polysubstance abuse ([place]) 10/11/2009  . LIPOMA 10/10/2009  . Tobacco use disorder 10/10/2009  . NECK PAIN 10/10/2009  . BACK PAIN, CHRONIC 07/11/2008    History reviewed. No pertinent surgical history.     Family History  Problem Relation Age of Onset  . Osteoarthritis Mother   . Sickle cell anemia Neg Hx   . Asthma Neg Hx   . Diabetes Neg Hx     Social History   Tobacco Use  . Smoking status: Current Every Day Smoker    Packs/day: 0.15    Types: Cigarettes    Last attempt to quit: 03/04/2009    Years since quitting: 11.6  . Smokeless tobacco: Never Used  Vaping Use  . Vaping Use: Never used  Substance Use Topics  . Alcohol use: Yes    Comment: 2 12 ounces every other day  . Drug use: Yes    Types: Cocaine    Comment: 2-3 times a week    Home Medications Prior to Admission medications   Medication Sig Start  Date End Date Taking? Authorizing Provider  acetaminophen (TYLENOL) 500 MG tablet Take 1 tablet (500 mg total) by mouth every 6 (six) hours as needed. 10/08/20  Yes Kaeo Jacome, PA-C  ibuprofen (ADVIL) 600 MG tablet Take 1 tablet (600 mg total) by mouth every 6 (six) hours as needed. 10/08/20  Yes Shadana Pry, PA-C  HYDROcodone-acetaminophen (NORCO/VICODIN) 5-325 MG tablet Take 1 tablet by mouth every 6 (six) hours as needed. 05/11/19   Hedges, Dellis Filbert, PA-C  ibuprofen (ADVIL,MOTRIN) 400 MG tablet Take 1 tablet (400 mg total) by mouth every 6 (six) hours as needed. 08/30/18   Lacretia Leigh, MD  methocarbamol (ROBAXIN) 500 MG tablet Take 1 tablet (500 mg total) by mouth 2 (two) times daily. 08/30/18   Lacretia Leigh, MD    Allergies    Patient has no known allergies.  Review of Systems   Review of Systems  Constitutional: Negative for chills and fever.  Musculoskeletal: Positive for arthralgias and joint swelling.  Skin: Negative for color change.    Physical Exam Updated Vital Signs BP (!) 110/53 (BP Location: Right Arm)   Pulse 60   Temp 98.8 F (37.1 C) (Oral)   Resp 15   SpO2 100%   Physical Exam  Vitals and nursing note reviewed.  Constitutional:      Appearance: He is not ill-appearing.  HENT:     Head: Normocephalic and atraumatic.  Eyes:     Conjunctiva/sclera: Conjunctivae normal.  Cardiovascular:     Rate and Rhythm: Normal rate and regular rhythm.     Pulses: Normal pulses.  Pulmonary:     Effort: Pulmonary effort is normal.     Breath sounds: Normal breath sounds. No wheezing, rhonchi or rales.  Musculoskeletal:     Comments: Mild swelling noted to the right wrist with TTP. No increased warmth or erythema. ROM limited s/2 swelling. No tenderness to hand, all digits, or proximal RUE. 2+ radial pulse.   Skin:    General: Skin is warm and dry.     Coloration: Skin is not jaundiced.  Neurological:     Mental Status: He is alert.     ED Results /  Procedures / Treatments   Labs (all labs ordered are listed, but only abnormal results are displayed) Labs Reviewed - No data to display  EKG None  Radiology DG Wrist Complete Right  Result Date: 10/08/2020 CLINICAL DATA:  Right wrist pain and swelling for 3-4 days. No injury. EXAM: RIGHT WRIST - COMPLETE 3+ VIEW COMPARISON:  12/09/2016. FINDINGS: No acute osseous abnormality. 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 Soft tissue swelling about the wrist. IMPRESSION: Soft tissue swelling and chondrocalcinosis involving the wrist. No acute osseous abnormality. Electronically Signed   By: Lorin Picket M.D.   On: 10/08/2020 15:02    Procedures Procedures   Medications Ordered in ED Medications - No data to display  ED Course  I have reviewed the triage vital signs and the nursing notes.  Pertinent labs & imaging results that were available during my care of the patient were reviewed by me and considered in my medical decision making (see chart for details).    MDM Rules/Calculators/A&P                          60 year old male presents to the ED today complaining of atraumatic right wrist pain for the past 3 to 4 days.  Also has some swelling.  No increased warmth to the touch or erythema.  On arrival to the ED vitals are stable.  Patient is afebrile, nontachycardic nontachypneic and appears to be in no acute distress.  He did have an x-ray obtained while in the waiting room which does show some soft tissue swelling and chondrocalcinosis involving the wrist.  On exam patient does have some obvious swelling however no increased warmth or erythema at this time.  Suspect the swelling may be likely related to his arthritis.  Low suspicion for septic arthritis or gout.  Advised ibuprofen and Tylenol.  For pain and PCP follow-up.  Will give patient information for [facility] for primary care needs.  She reports worsening pain with wrist flexion extension, will provide wrist brace  at this time.  Patient is in agreement with plan and stable for discharge.   This note was prepared using Dragon voice recognition software and may include unintentional dictation errors due to the inherent limitations of voice recognition software.   Final Clinical Impression(s) / ED Diagnoses Final diagnoses:  Sprain of right wrist, initial encounter    Rx / DC Orders ED Discharge Orders         Ordered    acetaminophen (TYLENOL) 500 MG tablet  Every 6 hours  PRN        10/08/20 1645    ibuprofen (ADVIL) 600 MG tablet  Every 6 hours PRN        10/08/20 1645           Discharge Instructions     Please pick up Ibuprofen and Tylenol and take as prescribed. It is recommended that you alternate between Ibuprofen and Tylenol (take Tylenol then 4 hours later take Ibuprofen). Wear wrist brace for comfort. While at home please elevate your wrist and apply ice to help reduce swelling.   Follow up with your PCP regarding your ED visit. If you do not have one you can follow up with [facility] for primary care needs.        Eustaquio Maize, PA-C 10/08/20 1646    Valarie Merino, MD 10/10/20 [phone]

## 2020-10-08 NOTE — ED Triage Notes (Signed)
Pt complains of right wrist pain that has been going on for 3-4 days. Pt wrist is swollen. Denies any injury.

## 2020-12-04 DIAGNOSIS — Z1152 Encounter for screening for COVID-19: Secondary | ICD-10-CM | POA: Diagnosis not present

## 2020-12-12 DIAGNOSIS — Z1152 Encounter for screening for COVID-19: Secondary | ICD-10-CM | POA: Diagnosis not present

## 2020-12-18 DIAGNOSIS — Z1152 Encounter for screening for COVID-19: Secondary | ICD-10-CM | POA: Diagnosis not present

## 2020-12-25 DIAGNOSIS — Z1152 Encounter for screening for COVID-19: Secondary | ICD-10-CM | POA: Diagnosis not present

## 2021-01-01 DIAGNOSIS — Z1152 Encounter for screening for COVID-19: Secondary | ICD-10-CM | POA: Diagnosis not present

## 2021-01-10 DIAGNOSIS — Z1152 Encounter for screening for COVID-19: Secondary | ICD-10-CM | POA: Diagnosis not present

## 2021-01-23 DIAGNOSIS — Z1152 Encounter for screening for COVID-19: Secondary | ICD-10-CM | POA: Diagnosis not present

## 2021-01-29 DIAGNOSIS — Z1152 Encounter for screening for COVID-19: Secondary | ICD-10-CM | POA: Diagnosis not present

## 2021-02-08 DIAGNOSIS — Z1152 Encounter for screening for COVID-19: Secondary | ICD-10-CM | POA: Diagnosis not present

## 2021-02-12 DIAGNOSIS — Z1152 Encounter for screening for COVID-19: Secondary | ICD-10-CM | POA: Diagnosis not present

## 2021-02-23 DIAGNOSIS — Z1152 Encounter for screening for COVID-19: Secondary | ICD-10-CM | POA: Diagnosis not present

## 2021-02-25 DIAGNOSIS — Z1152 Encounter for screening for COVID-19: Secondary | ICD-10-CM | POA: Diagnosis not present

## 2021-03-02 DIAGNOSIS — Z1152 Encounter for screening for COVID-19: Secondary | ICD-10-CM | POA: Diagnosis not present

## 2021-03-04 DIAGNOSIS — Z1152 Encounter for screening for COVID-19: Secondary | ICD-10-CM | POA: Diagnosis not present

## 2021-03-08 DIAGNOSIS — Z1152 Encounter for screening for COVID-19: Secondary | ICD-10-CM | POA: Diagnosis not present

## 2021-03-18 DIAGNOSIS — Z1152 Encounter for screening for COVID-19: Secondary | ICD-10-CM | POA: Diagnosis not present

## 2021-03-21 DIAGNOSIS — Z1152 Encounter for screening for COVID-19: Secondary | ICD-10-CM | POA: Diagnosis not present

## 2021-03-23 ENCOUNTER — Emergency Department (HOSPITAL_COMMUNITY)
Admission: EM | Admit: 2021-03-23 | Discharge: 2021-03-23 | Disposition: A | Discharge status: Home / Self Care | Payer: Medicaid Other | Attending: Emergency Medicine | Admitting: Emergency Medicine

## 2021-03-23 ENCOUNTER — Encounter (HOSPITAL_COMMUNITY): Payer: Self-pay

## 2021-03-23 ENCOUNTER — Emergency Department (HOSPITAL_COMMUNITY): Payer: Medicaid Other

## 2021-03-23 ENCOUNTER — Other Ambulatory Visit: Payer: Self-pay

## 2021-03-23 DIAGNOSIS — F17210 Nicotine dependence, cigarettes, uncomplicated: Secondary | ICD-10-CM | POA: Diagnosis not present

## 2021-03-23 DIAGNOSIS — M25559 Pain in unspecified hip: Secondary | ICD-10-CM | POA: Diagnosis not present

## 2021-03-23 DIAGNOSIS — M5432 Sciatica, left side: Secondary | ICD-10-CM

## 2021-03-23 DIAGNOSIS — M5442 Lumbago with sciatica, left side: Secondary | ICD-10-CM | POA: Diagnosis not present

## 2021-03-23 DIAGNOSIS — M25552 Pain in left hip: Secondary | ICD-10-CM | POA: Diagnosis not present

## 2021-03-23 DIAGNOSIS — M5136 Other intervertebral disc degeneration, lumbar region: Secondary | ICD-10-CM | POA: Diagnosis not present

## 2021-03-23 DIAGNOSIS — M545 Low back pain, unspecified: Secondary | ICD-10-CM | POA: Diagnosis not present

## 2021-03-23 DIAGNOSIS — R52 Pain, unspecified: Secondary | ICD-10-CM

## 2021-03-23 MED ORDER — METHOCARBAMOL 500 MG PO TABS: 500.0000 mg | ORAL_TABLET | Freq: Two times a day (BID) | ORAL | 0 refills | Status: DC

## 2021-03-23 MED ORDER — DICLOFENAC SODIUM 1 % EX GEL: 2.0000 g | Freq: Four times a day (QID) | CUTANEOUS | 0 refills | Status: AC

## 2021-03-23 MED ORDER — HYDROCODONE-ACETAMINOPHEN 5-325 MG PO TABS
1.0000 | ORAL_TABLET | Freq: Once | ORAL | Status: AC
  Administered 2021-03-23: 1 via ORAL
  Filled 2021-03-23: qty 1

## 2021-03-23 MED ORDER — LIDOCAINE 5 % EX PTCH
1.0000 | MEDICATED_PATCH | CUTANEOUS | Status: DC
  Administered 2021-03-23: 1 via TRANSDERMAL
  Filled 2021-03-23: qty 1

## 2021-03-23 MED ORDER — LIDOCAINE 5 % EX PTCH: 1.0000 | MEDICATED_PATCH | CUTANEOUS | 0 refills | Status: AC

## 2021-03-23 NOTE — Discharge Instructions (Addendum)
Follow up with your primary care provider. Apply warm compresses to left lower back followed by gentle stretching. Apply Lidoderm and Diclofenac as prescribed. Take Robaxin as prescribed.

## 2021-03-23 NOTE — ED Triage Notes (Signed)
BIB EMS from home. C/o left hip pain no fall or trauma. Pain onset about 2 days ago, he also per patient has an abscess on that left leg.

## 2021-03-23 NOTE — ED Provider Notes (Signed)
[facility] EMERGENCY DEPARTMENT Provider Note   CSN: [id number] Arrival date & time: 03/23/21  6734     History Chief Complaint  Patient presents with   Hip Pain    Left sided    Abscess    Left leg     [facility] is a 60 y.o. male.  60 year old male brought in by EMS with report of left hip pain x2 days.  Patient reports this to be a new problem however later states that he is already gone through physical therapy for this problem without improvement.  He has a large cyst to his left thigh medially which he states is unchanged and not painful today.  Pain in left hip and back occurring with any movement of either leg.  He denies loss of bowel or bladder control, groin numbness, abdominal pain.  No other complaints or concerns today.      Past Medical History:  Diagnosis Date   Arthritis    neck and left shoulder   Chronic low back pain    ETOH abuse    cociane abuse, tobacco abuse   Tobacco dependence     Patient Active Problem List   Diagnosis Date Noted   High risk social situation 08/16/2018   Encounter for physical examination 07/21/2018   Left groin pain 06/05/2015   Arthritis 06/05/2015   Polysubstance abuse ([place]) 10/11/2009   LIPOMA 10/10/2009   Tobacco use disorder 10/10/2009   NECK PAIN 10/10/2009   BACK PAIN, CHRONIC 07/11/2008    History reviewed. No pertinent surgical history.     Family History  Problem Relation Age of Onset   Osteoarthritis Mother    Sickle cell anemia Neg Hx    Asthma Neg Hx    Diabetes Neg Hx     Social History   Tobacco Use   Smoking status: Every Day    Packs/day: 0.15    Types: Cigarettes    Last attempt to quit: 03/04/2009    Years since quitting: 12.0   Smokeless tobacco: Never  Vaping Use   Vaping Use: Never used  Substance Use Topics   Alcohol use: Yes    Comment: 2 12 ounces every other day   Drug use: Yes    Types: Cocaine    Comment: 2-3 times a week    Home  Medications Prior to Admission medications   Medication Sig Start Date End Date Taking? Authorizing Provider  diclofenac Sodium (VOLTAREN) 1 % GEL Apply 2 g topically 4 (four) times daily. 03/23/21  Yes Tacy Learn, PA-C  lidocaine (LIDODERM) 5 % Place 1 patch onto the skin daily. Remove & Discard patch within 12 hours or as directed by MD 03/23/21  Yes Tacy Learn, PA-C  acetaminophen (TYLENOL) 500 MG tablet Take 1 tablet (500 mg total) by mouth every 6 (six) hours as needed. 10/08/20   Eustaquio Maize, PA-C  HYDROcodone-acetaminophen (NORCO/VICODIN) 5-325 MG tablet Take 1 tablet by mouth every 6 (six) hours as needed. 05/11/19   Hedges, Dellis Filbert, PA-C  ibuprofen (ADVIL) 600 MG tablet Take 1 tablet (600 mg total) by mouth every 6 (six) hours as needed. 10/08/20   Alroy Bailiff, Margaux, PA-C  ibuprofen (ADVIL,MOTRIN) 400 MG tablet Take 1 tablet (400 mg total) by mouth every 6 (six) hours as needed. 08/30/18   Lacretia Leigh, MD  methocarbamol (ROBAXIN) 500 MG tablet Take 1 tablet (500 mg total) by mouth 2 (two) times daily. 03/23/21   Tacy Learn, PA-C  Allergies    Patient has no known allergies.  Review of Systems   Review of Systems  Constitutional:  Negative for fever.  Respiratory:  Negative for shortness of breath.   Cardiovascular:  Negative for chest pain.  Gastrointestinal:  Negative for abdominal pain, nausea and vomiting.  Genitourinary:  Negative for difficulty urinating.  Musculoskeletal:  Positive for arthralgias, back pain and myalgias. Negative for joint swelling.  Skin:  Negative for rash and wound.  Allergic/Immunologic: Negative for immunocompromised state.  Neurological:  Negative for weakness and numbness.  Hematological:  Negative for adenopathy.  Psychiatric/Behavioral:  Negative for confusion.    Physical Exam Updated Vital Signs BP (!) 123/91 (BP Location: Right Arm)   Pulse 87   Temp 98.1 F (36.7 C) (Oral)   Resp 18   Ht 6' (1.829 m)   Wt 63.5 kg    SpO2 98%   BMI 18.99 kg/m   Physical Exam Vitals and nursing note reviewed.  Constitutional:      General: He is not in acute distress.    Appearance: He is well-developed. He is not diaphoretic.     Comments: thin  HENT:     Head: Normocephalic and atraumatic.  Cardiovascular:     Pulses: Normal pulses.  Pulmonary:     Effort: Pulmonary effort is normal.  Abdominal:     Palpations: Abdomen is soft.     Tenderness: There is no abdominal tenderness.  Musculoskeletal:        General: Tenderness present. No swelling, deformity or signs of injury.     Lumbar back: Positive right straight leg raise test and positive left straight leg raise test.     Comments: Severe pain in left posterior hip with logroll of left leg or any movement of the left leg.  Tenderness patient of the left SI joint.  Patient is able to sit up in bed with difficulty.  Straight leg raise positive for left lower back pain with extension of either leg.  Skin:    General: Skin is warm and dry.     Findings: No erythema or rash.  Neurological:     Mental Status: He is alert and oriented to person, place, and time.     Deep Tendon Reflexes: Babinski sign absent on the right side. Babinski sign absent on the left side.     Reflex Scores:      Patellar reflexes are 1+ on the right side and 1+ on the left side.      Achilles reflexes are 1+ on the right side and 1+ on the left side. Psychiatric:        Behavior: Behavior normal.    ED Results / Procedures / Treatments   Labs (all labs ordered are listed, but only abnormal results are displayed) Labs Reviewed - No data to display  EKG None  Radiology DG Lumbar Spine Complete  Result Date: 03/23/2021 CLINICAL DATA:  Pain without injury. EXAM: LUMBAR SPINE - COMPLETE 4+ VIEW COMPARISON:  05/11/2019 FINDINGS: There is normal alignment of the lumbar spine. Disc height loss noted at L5-S1, associated with osteophytes. No lytic or blastic lesions. No acute fracture.  Bowel gas pattern is nonobstructive. IMPRESSION: No evidence for acute  abnormality.  L5-S1 degenerative changes. Electronically Signed   By: Nolon Nations M.D.   On: 03/23/2021 10:28   DG Hip Unilat With Pelvis 2-3 Views Left  Result Date: 03/23/2021 CLINICAL DATA:  Pain without injury. EXAM: DG HIP (WITH OR WITHOUT PELVIS) 2-3V  LEFT COMPARISON:  None. FINDINGS: There is no evidence of hip fracture or dislocation. There is no evidence of arthropathy or other focal bone abnormality. IMPRESSION: Negative. Electronically Signed   By: Nolon Nations M.D.   On: 03/23/2021 10:27    Procedures Procedures   Medications Ordered in ED Medications  lidocaine (LIDODERM) 5 % 1 patch (1 patch Transdermal Patch Applied 03/23/21 0941)  HYDROcodone-acetaminophen (NORCO/VICODIN) 5-325 MG per tablet 1 tablet (1 tablet Oral Given 03/23/21 0941)    ED Course  I have reviewed the triage vital signs and the nursing notes.  Pertinent labs & imaging results that were available during my care of the patient were reviewed by me and considered in my medical decision making (see chart for details).  Clinical Course as of 03/23/21 1042  Sat Mar 23, 4944  5041 60 year old male with complaint of posterior left hip pain.  On exam, found to have significant pain with any range of motion of the legs, also left SI joint tenderness.  X-ray of left hip and lumbar spine with SI views, shows degenerative changes, negative for fracture.  Patient was treated with Norco and Lidoderm patch, pain has improved. Discussed results with patient, recommend he follow-up with his primary care provider, states he does not have 1 and is referred to local clinic.  Given prescription for Robaxin, Lidoderm, topical diclofenac.  Stressed importance of follow-up for therapy versus specialty referral from PCP if he is already completed therapy without improvement in his symptoms. [LM]    Clinical Course User Index [LM] Roque Lias    MDM Rules/Calculators/A&P                           Final Clinical Impression(s) / ED Diagnoses Final diagnoses:  Pain  Degenerative lumbar disc  Sciatica of left side    Rx / DC Orders ED Discharge Orders          Ordered    methocarbamol (ROBAXIN) 500 MG tablet  2 times daily        03/23/21 1032    lidocaine (LIDODERM) 5 %  Every 24 hours        03/23/21 1032    diclofenac Sodium (VOLTAREN) 1 % GEL  4 times daily        03/23/21 1032             Roque Lias 03/23/21 1042    Hayden Rasmussen, MD 03/23/21 1755

## 2021-03-25 ENCOUNTER — Telehealth: Payer: Self-pay

## 2021-03-25 DIAGNOSIS — Z1152 Encounter for screening for COVID-19: Secondary | ICD-10-CM | POA: Diagnosis not present

## 2021-03-25 NOTE — Telephone Encounter (Signed)
Transition Care Management Unsuccessful Follow-up Telephone Call  Date of discharge and from where:  03/23/2021- [facility]   Attempts:  1st Attempt  Reason for unsuccessful TCM follow-up call:  Unable to reach patient

## 2021-03-26 NOTE — Telephone Encounter (Signed)
Transition Care Management Unsuccessful Follow-up Telephone Call  Date of discharge and from where:  [facility]   Attempts:  2nd Attempt  Reason for unsuccessful TCM follow-up call:  Left voice message

## 2021-03-27 NOTE — Telephone Encounter (Signed)
Transition Care Management Unsuccessful Follow-up Telephone Call  Date of discharge and from where:  03/23/2021-[facility]   Attempts:  3rd Attempt  Reason for unsuccessful TCM follow-up call:  Unable to reach patient

## 2021-03-30 DIAGNOSIS — Z1152 Encounter for screening for COVID-19: Secondary | ICD-10-CM | POA: Diagnosis not present

## 2021-04-02 DIAGNOSIS — Z1152 Encounter for screening for COVID-19: Secondary | ICD-10-CM | POA: Diagnosis not present

## 2021-04-05 DIAGNOSIS — Z1152 Encounter for screening for COVID-19: Secondary | ICD-10-CM | POA: Diagnosis not present

## 2021-04-11 DIAGNOSIS — Z1152 Encounter for screening for COVID-19: Secondary | ICD-10-CM | POA: Diagnosis not present

## 2021-04-15 DIAGNOSIS — Z1152 Encounter for screening for COVID-19: Secondary | ICD-10-CM | POA: Diagnosis not present

## 2021-04-19 DIAGNOSIS — Z1152 Encounter for screening for COVID-19: Secondary | ICD-10-CM | POA: Diagnosis not present

## 2021-04-23 DIAGNOSIS — Z1152 Encounter for screening for COVID-19: Secondary | ICD-10-CM | POA: Diagnosis not present

## 2021-04-26 DIAGNOSIS — Z1152 Encounter for screening for COVID-19: Secondary | ICD-10-CM | POA: Diagnosis not present

## 2021-04-29 DIAGNOSIS — Z1152 Encounter for screening for COVID-19: Secondary | ICD-10-CM | POA: Diagnosis not present

## 2021-05-28 DIAGNOSIS — Z1152 Encounter for screening for COVID-19: Secondary | ICD-10-CM | POA: Diagnosis not present

## 2021-05-29 DIAGNOSIS — Z20822 Contact with and (suspected) exposure to covid-19: Secondary | ICD-10-CM | POA: Diagnosis not present

## 2021-06-05 DIAGNOSIS — Z1152 Encounter for screening for COVID-19: Secondary | ICD-10-CM | POA: Diagnosis not present

## 2021-06-07 DIAGNOSIS — Z1152 Encounter for screening for COVID-19: Secondary | ICD-10-CM | POA: Diagnosis not present

## 2021-06-12 DIAGNOSIS — Z1152 Encounter for screening for COVID-19: Secondary | ICD-10-CM | POA: Diagnosis not present

## 2021-06-15 NOTE — Progress Notes (Deleted)
  Subjective:    [facility] - 60 y.o. male MRN [id number]  Date of birth: [birth year]  HPI  [facility] is to establish care.  Current issues and/or concerns:  ROS per HPI     Health Maintenance:  - *** Health Maintenance Due  Topic Date Due   COVID-19 Vaccine (1) Never done   COLONOSCOPY (Pts 45-24yrs Insurance coverage will need to be confirmed)  Never done   Zoster Vaccines- Shingrix (1 of 2) Never done   COLON CANCER SCREENING ANNUAL FOBT  07/11/2016   TETANUS/TDAP  11/01/2019   INFLUENZA VACCINE  04/08/2021     Past Medical History: Patient Active Problem List   Diagnosis Date Noted   High risk social situation 08/16/2018   Encounter for physical examination 07/21/2018   Left groin pain 06/05/2015   Arthritis 06/05/2015   Polysubstance abuse ([place]) 10/11/2009   LIPOMA 10/10/2009   Tobacco use disorder 10/10/2009   NECK PAIN 10/10/2009   BACK PAIN, CHRONIC 07/11/2008      Social History   reports that he has been smoking cigarettes. He has been smoking an average of .15 packs per day. He has never used smokeless tobacco. He reports current alcohol use. He reports current drug use. Drug: Cocaine.   Family History  family history includes Osteoarthritis in his mother.   Medications: reviewed and updated   Objective:   Physical Exam There were no vitals taken for this visit. Physical Exam      Assessment & Plan:         Patient was given clear instructions to go to Emergency Department or return to medical center if symptoms don't improve, worsen, or new problems develop.The patient verbalized understanding.  I discussed the assessment and treatment plan with the patient. The patient was provided an opportunity to ask questions and all were answered. The patient agreed with the plan and demonstrated an understanding of the instructions.   The patient was advised to call back or seek an in-person evaluation if the symptoms worsen or if the  condition fails to improve as anticipated.    Durene Fruits, NP 06/15/2021, 2:44 PM Primary Care at [facility]

## 2021-06-19 ENCOUNTER — Ambulatory Visit: Payer: Medicaid Other | Admitting: Family

## 2021-06-19 DIAGNOSIS — Z7689 Persons encountering health services in other specified circumstances: Secondary | ICD-10-CM

## 2021-06-21 DIAGNOSIS — Z1152 Encounter for screening for COVID-19: Secondary | ICD-10-CM | POA: Diagnosis not present

## 2021-07-31 NOTE — Progress Notes (Signed)
Erroneous encounter

## 2021-08-06 ENCOUNTER — Telehealth (INDEPENDENT_AMBULATORY_CARE_PROVIDER_SITE_OTHER): Payer: Medicaid Other | Admitting: Nurse Practitioner

## 2021-08-06 ENCOUNTER — Other Ambulatory Visit: Payer: Self-pay

## 2021-08-06 ENCOUNTER — Encounter: Payer: Self-pay | Admitting: Nurse Practitioner

## 2021-08-06 ENCOUNTER — Encounter: Payer: Medicaid Other | Admitting: Family

## 2021-08-06 DIAGNOSIS — M25512 Pain in left shoulder: Secondary | ICD-10-CM | POA: Diagnosis not present

## 2021-08-06 DIAGNOSIS — M25552 Pain in left hip: Secondary | ICD-10-CM

## 2021-08-06 DIAGNOSIS — L02419 Cutaneous abscess of limb, unspecified: Secondary | ICD-10-CM | POA: Diagnosis not present

## 2021-08-06 DIAGNOSIS — Z7689 Persons encountering health services in other specified circumstances: Secondary | ICD-10-CM

## 2021-08-06 MED ORDER — MELOXICAM 7.5 MG PO TABS: 7.5000 mg | ORAL_TABLET | Freq: Every day | ORAL | 0 refills | Status: AC

## 2021-08-06 MED ORDER — CEPHALEXIN 500 MG PO CAPS: 500.0000 mg | ORAL_CAPSULE | Freq: Three times a day (TID) | ORAL | 0 refills | Status: AC

## 2021-08-06 MED ORDER — MUPIROCIN CALCIUM 2 % EX CREA: 1.0000 "application " | TOPICAL_CREAM | Freq: Two times a day (BID) | CUTANEOUS | 0 refills | Status: AC

## 2021-08-06 NOTE — Patient Instructions (Addendum)
Abscess to upper inner thighs and left shoulder Arthritis:  Will order Mobic  Will order keflex for skin lesions  Will order Bactroban ointment   Follow up:  Follow up with Dr. Redmond Pulling to establish care

## 2021-08-06 NOTE — Progress Notes (Signed)
Virtual Visit via Telephone Note  I connected with [facility] on 08/06/21 at  1:20 PM EST by telephone and verified that I am speaking with the correct person using two identifiers.  Location: Patient: home Provider: office   I discussed the limitations, risks, security and privacy concerns of performing an evaluation and management service by telephone and the availability of in person appointments. I also discussed with the patient that there may be a patient responsible charge related to this service. The patient expressed understanding and agreed to proceed.   History of Present Illness:  Patient presents today to establish care through telephone visit.  Patient states that he is not currently on any prescription medications.  He does have a history of chronic arthritis and states that he would like something for pain.  He was recently seen in the ED for arthritic type pain which is mainly in his hips and also for skin abscess.  He states that the areas that he has currently are similar to what he had when he was in the ED.  He states that he has a large bulla to his left shoulder that is painful, red, warm to the touch.  He states that he also has moles to his bilateral inner upper thighs which are painful, red, warm to the touch.  We discussed that this is hard to assess over the telephone but I will get him started on an antibiotic and order Bactroban ointment for these areas.  We can start him on Mobic for his pain.  We will schedule a close follow-up with Dr. Redmond Pulling per patient request for further evaluation of these areas. Denies f/c/s, n/v/d, hemoptysis, PND, chest pain or edema.      Observations/Objective:  Vitals with BMI 03/23/2021 03/23/2021 03/23/2021  Height - - 6\' 0"   Weight - - 140 lbs  BMI - - 81.85  Systolic 631 497 -  Diastolic 78 91 -  Pulse 95 87 -      Assessment and Plan:  Abscess to upper inner thighs and left shoulder Arthritis:  Will order  Mobic  Will order keflex for skin lesions  Will order Bactroban ointment   Follow up:  Follow up with Dr. Redmond Pulling to establish care   I discussed the assessment and treatment plan with the patient. The patient was provided an opportunity to ask questions and all were answered. The patient agreed with the plan and demonstrated an understanding of the instructions.   The patient was advised to call back or seek an in-person evaluation if the symptoms worsen or if the condition fails to improve as anticipated.  I provided 23 minutes of non-face-to-face time during this encounter.   Fenton Foy, NP

## 2021-09-05 ENCOUNTER — Other Ambulatory Visit: Payer: Self-pay | Admitting: Nurse Practitioner

## 2021-10-28 DIAGNOSIS — Z20822 Contact with and (suspected) exposure to covid-19: Secondary | ICD-10-CM | POA: Diagnosis not present

## 2021-11-05 DIAGNOSIS — Z20822 Contact with and (suspected) exposure to covid-19: Secondary | ICD-10-CM | POA: Diagnosis not present

## 2021-12-06 DIAGNOSIS — Z1152 Encounter for screening for COVID-19: Secondary | ICD-10-CM | POA: Diagnosis not present

## 2021-12-13 DIAGNOSIS — Z1152 Encounter for screening for COVID-19: Secondary | ICD-10-CM | POA: Diagnosis not present

## 2021-12-22 DIAGNOSIS — Z1152 Encounter for screening for COVID-19: Secondary | ICD-10-CM | POA: Diagnosis not present

## 2021-12-23 DIAGNOSIS — Z20822 Contact with and (suspected) exposure to covid-19: Secondary | ICD-10-CM | POA: Diagnosis not present

## 2021-12-28 DIAGNOSIS — Z1152 Encounter for screening for COVID-19: Secondary | ICD-10-CM | POA: Diagnosis not present

## 2022-01-03 DIAGNOSIS — Z1152 Encounter for screening for COVID-19: Secondary | ICD-10-CM | POA: Diagnosis not present

## 2022-01-10 DIAGNOSIS — Z1152 Encounter for screening for COVID-19: Secondary | ICD-10-CM | POA: Diagnosis not present

## 2022-01-19 DIAGNOSIS — Z1152 Encounter for screening for COVID-19: Secondary | ICD-10-CM | POA: Diagnosis not present

## 2022-01-24 DIAGNOSIS — Z1152 Encounter for screening for COVID-19: Secondary | ICD-10-CM | POA: Diagnosis not present

## 2022-01-31 DIAGNOSIS — Z1152 Encounter for screening for COVID-19: Secondary | ICD-10-CM | POA: Diagnosis not present

## 2022-02-16 DIAGNOSIS — Z1152 Encounter for screening for COVID-19: Secondary | ICD-10-CM | POA: Diagnosis not present

## 2022-02-18 DIAGNOSIS — Z1152 Encounter for screening for COVID-19: Secondary | ICD-10-CM | POA: Diagnosis not present

## 2022-03-14 DIAGNOSIS — Z1152 Encounter for screening for COVID-19: Secondary | ICD-10-CM | POA: Diagnosis not present

## 2022-03-20 DIAGNOSIS — Z1152 Encounter for screening for COVID-19: Secondary | ICD-10-CM | POA: Diagnosis not present

## 2022-03-24 DIAGNOSIS — Z20822 Contact with and (suspected) exposure to covid-19: Secondary | ICD-10-CM | POA: Diagnosis not present

## 2022-04-06 ENCOUNTER — Emergency Department (HOSPITAL_COMMUNITY): Payer: Medicaid Other

## 2022-04-06 ENCOUNTER — Encounter (HOSPITAL_COMMUNITY): Payer: Self-pay

## 2022-04-06 ENCOUNTER — Other Ambulatory Visit: Payer: Self-pay

## 2022-04-06 ENCOUNTER — Emergency Department (HOSPITAL_COMMUNITY)
Admission: EM | Admit: 2022-04-06 | Discharge: 2022-04-06 | Disposition: A | Discharge status: Home / Self Care | Payer: Medicaid Other | Attending: Emergency Medicine | Admitting: Emergency Medicine

## 2022-04-06 DIAGNOSIS — M25522 Pain in left elbow: Secondary | ICD-10-CM | POA: Insufficient documentation

## 2022-04-06 DIAGNOSIS — M25529 Pain in unspecified elbow: Secondary | ICD-10-CM | POA: Diagnosis not present

## 2022-04-06 MED ORDER — ACETAMINOPHEN 500 MG PO TABS
1000.0000 mg | ORAL_TABLET | Freq: Once | ORAL | Status: AC
  Administered 2022-04-06: 1000 mg via ORAL
  Filled 2022-04-06: qty 2

## 2022-04-06 MED ORDER — IBUPROFEN 600 MG PO TABS: 600.0000 mg | ORAL_TABLET | Freq: Four times a day (QID) | ORAL | 0 refills | Status: AC | PRN

## 2022-04-06 MED ORDER — IBUPROFEN 200 MG PO TABS
600.0000 mg | ORAL_TABLET | Freq: Once | ORAL | Status: AC
  Administered 2022-04-06: 600 mg via ORAL
  Filled 2022-04-06: qty 3

## 2022-04-06 NOTE — Discharge Instructions (Addendum)
Take ibuprofen 600 mg every 6 hours, you can use Tylenol 1000 mg every 6 hours as needed for breakthrough pain.  You can use Ace wrap and sling but make sure that you take your arm out and do range of motion multiple times throughout the day otherwise your pain and symptoms may worsen.  You can apply ice to help with pain as well.  Please follow-up with orthopedics for further evaluation of your elbow pain.  If you have worsening pain or swelling develop fevers or the elbow becomes red and hot to the touch return for reevaluation.

## 2022-04-06 NOTE — ED Triage Notes (Signed)
Per EMS- Patient c/o left elbow pain that is worse when he extends his left arm. Patient denies any injury.

## 2022-04-06 NOTE — ED Provider Notes (Signed)
[facility] DE[facility] Provider Note   CSN: [id number] Arrival date & time: 04/06/22  0746     History  Chief Complaint  Patient presents with   Joint Swelling    Lucas Walker is a 61 y.o. male.  Lucas Walker is a 61 y.o. male with a history of chronic low back pain, arthritis and substance abuse, who presents to the ED via EMS for evaluation of elbow pain.  Pain has been present for 2 days.  He denies any inciting injury.  Reports pain is worse in the elbow with movement, and in particular with extension of the arm.  He has not tried anything to treat pain.  Denies any swelling, numbness or weakness.  No fevers or chills.  No other aggravating or alleviating factors.  The history is provided by the patient.       Home Medications Prior to Admission medications   Medication Sig Start Date End Date Taking? Authorizing Provider  ibuprofen (ADVIL) 600 MG tablet Take 1 tablet (600 mg total) by mouth every 6 (six) hours as needed. 04/06/22  Yes Jacqlyn Larsen, PA-C  acetaminophen (TYLENOL) 500 MG tablet Take 1 tablet (500 mg total) by mouth every 6 (six) hours as needed. 10/08/20   Alroy Bailiff, Margaux, PA-C  diclofenac Sodium (VOLTAREN) 1 % GEL Apply 2 g topically 4 (four) times daily. 03/23/21   Tacy Learn, PA-C  lidocaine (LIDODERM) 5 % Place 1 patch onto the skin daily. Remove & Discard patch within 12 hours or as directed by MD 03/23/21   Tacy Learn, PA-C  meloxicam (MOBIC) 7.5 MG tablet Take 1 tablet (7.5 mg total) by mouth daily. 08/06/21   Fenton Foy, NP  mupirocin cream (BACTROBAN) 2 % Apply 1 application topically 2 (two) times daily. 08/06/21   Fenton Foy, NP      Allergies    Patient has no known allergies.    Review of Systems   Review of Systems  Constitutional:  Negative for chills and fever.  Musculoskeletal:  Positive for arthralgias. Negative for joint swelling.  Neurological:  Negative for weakness and numbness.     Physical Exam Updated Vital Signs BP 129/81 (BP Location: Right Arm)   Pulse 60   Temp 98.2 F (36.8 C) (Oral)   Resp 16   Ht 6' (1.829 m)   Wt 63.5 kg   SpO2 98%   BMI 18.99 kg/m  Physical Exam Vitals and nursing note reviewed.  Constitutional:      General: He is not in acute distress.    Appearance: Normal appearance. He is well-developed. He is not ill-appearing or diaphoretic.  HENT:     Head: Normocephalic and atraumatic.  Eyes:     General:        Right eye: No discharge.        Left eye: No discharge.  Pulmonary:     Effort: Pulmonary effort is normal. No respiratory distress.  Musculoskeletal:        General: Tenderness present.     Comments: Tenderness over the left elbow without appreciable swelling, no overlying erythema or warmth.  Limited range of motion due to pain, no tenderness in the forearm or upper arm.  Distal pulses 2+, normal sensation and strength.  Skin:    General: Skin is warm and dry.  Neurological:     Mental Status: He is alert and oriented to person, place, and time.     Coordination: Coordination normal.  Psychiatric:        Mood and Affect: Mood normal.        Behavior: Behavior normal.     ED Results / Procedures / Treatments   Labs (all labs ordered are listed, but only abnormal results are displayed) Labs Reviewed - No data to display  EKG None  Radiology DG Elbow Complete Left  Result Date: 04/06/2022 CLINICAL DATA:  61 year old male with acute LEFT elbow pain. No known injury. Initial encounter. EXAM: LEFT ELBOW - COMPLETE 4 VIEW COMPARISON:  None Available. FINDINGS: An elbow effusion is noted. There is no evidence of acute fracture or dislocation. Mild degenerative changes at the humeral ulnar joint noted. No focal bony lesions or evidence of acute osteomyelitis are identified. IMPRESSION: 1. Elbow effusion without acute bony abnormality. 2. Mild degenerative changes at the humeral ulnar joint. Electronically Signed   By:  Margarette Canada M.D.   On: 04/06/2022 08:46    Procedures Procedures    Medications Ordered in ED Medications  ibuprofen (ADVIL) tablet 600 mg (600 mg Oral Given 04/06/22 0846)  acetaminophen (TYLENOL) tablet 1,000 mg (1,000 mg Oral Given 04/06/22 0847)    ED Course/ Medical Decision Making/ A&P                           Medical Decision Making Amount and/or Complexity of Data Reviewed Radiology: ordered.   Patient presents with pain to the left elbow, no reported injury or trauma.  Elbow is without overlying erythema or warmth, no significant swelling, able to range elbow but limited somewhat due to pain.  The left upper extremity is neurovascularly intact.   I personally viewed and interpreted x-ray with no evidence of fracture or dislocation, no significant effusion noted, no fat pad sign.  Radiologist noted this mild effusion.  After anti-inflammatories patient has improved range of motion and improvement in his pain.  Patient provided Ace wrap, encouraged to do range of motion exercises and prescribed NSAIDs to continue to use to help with pain.  Considered potential gout versus overuse injury.  Very low suspicion for septic arthritis.  We will have patient follow-up with orthopedics.  Return precautions discussed.  At this time there does not appear to be any evidence of an acute emergency medical condition requiring further emergent evaluation and the patient appears stable for discharge with appropriate outpatient follow up. Diagnosis and return precautions discussed with patient who verbalizes understanding and is agreeable to discharge.           Final Clinical Impression(s) / ED Diagnoses Final diagnoses:  Left elbow pain    Rx / DC Orders ED Discharge Orders          Ordered    ibuprofen (ADVIL) 600 MG tablet  Every 6 hours PRN        04/06/22 0939              Jacqlyn Larsen, PA-C 04/06/22 4827    Wyvonnia Dusky, MD 04/06/22 1029

## 2022-04-11 DIAGNOSIS — Z1152 Encounter for screening for COVID-19: Secondary | ICD-10-CM | POA: Diagnosis not present

## 2022-04-12 DIAGNOSIS — Z1152 Encounter for screening for COVID-19: Secondary | ICD-10-CM | POA: Diagnosis not present

## 2022-04-18 DIAGNOSIS — Z1152 Encounter for screening for COVID-19: Secondary | ICD-10-CM | POA: Diagnosis not present

## 2022-05-06 DIAGNOSIS — Z1152 Encounter for screening for COVID-19: Secondary | ICD-10-CM | POA: Diagnosis not present

## 2022-05-12 DIAGNOSIS — Z1152 Encounter for screening for COVID-19: Secondary | ICD-10-CM | POA: Diagnosis not present

## 2022-05-30 DIAGNOSIS — Z1152 Encounter for screening for COVID-19: Secondary | ICD-10-CM | POA: Diagnosis not present

## 2022-05-31 DIAGNOSIS — Z1152 Encounter for screening for COVID-19: Secondary | ICD-10-CM | POA: Diagnosis not present

## 2022-06-05 DIAGNOSIS — Z1152 Encounter for screening for COVID-19: Secondary | ICD-10-CM | POA: Diagnosis not present

## 2022-06-23 DIAGNOSIS — Z1152 Encounter for screening for COVID-19: Secondary | ICD-10-CM | POA: Diagnosis not present

## 2022-06-26 DIAGNOSIS — U071 COVID-19: Secondary | ICD-10-CM | POA: Diagnosis not present

## 2022-07-03 DIAGNOSIS — Z1152 Encounter for screening for COVID-19: Secondary | ICD-10-CM | POA: Diagnosis not present

## 2022-07-10 DIAGNOSIS — Z1152 Encounter for screening for COVID-19: Secondary | ICD-10-CM | POA: Diagnosis not present

## 2022-07-23 DIAGNOSIS — Z1152 Encounter for screening for COVID-19: Secondary | ICD-10-CM | POA: Diagnosis not present

## 2022-07-25 DIAGNOSIS — Z1152 Encounter for screening for COVID-19: Secondary | ICD-10-CM | POA: Diagnosis not present

## 2022-08-08 DIAGNOSIS — Z1152 Encounter for screening for COVID-19: Secondary | ICD-10-CM | POA: Diagnosis not present

## 2022-08-18 DIAGNOSIS — Z1152 Encounter for screening for COVID-19: Secondary | ICD-10-CM | POA: Diagnosis not present

## 2022-08-22 DIAGNOSIS — Z1152 Encounter for screening for COVID-19: Secondary | ICD-10-CM | POA: Diagnosis not present

## 2022-09-06 DIAGNOSIS — Z1152 Encounter for screening for COVID-19: Secondary | ICD-10-CM | POA: Diagnosis not present

## 2023-04-12 IMAGING — CR DG HIP (WITH OR WITHOUT PELVIS) 2-3V*L*
3 series · 3 of 3 positions shown · non-contrast
Comparison: None.

CLINICAL DATA: Pain without injury.

EXAM:
DG HIP (WITH OR WITHOUT PELVIS) 2-3V LEFT

[pelvis ap]
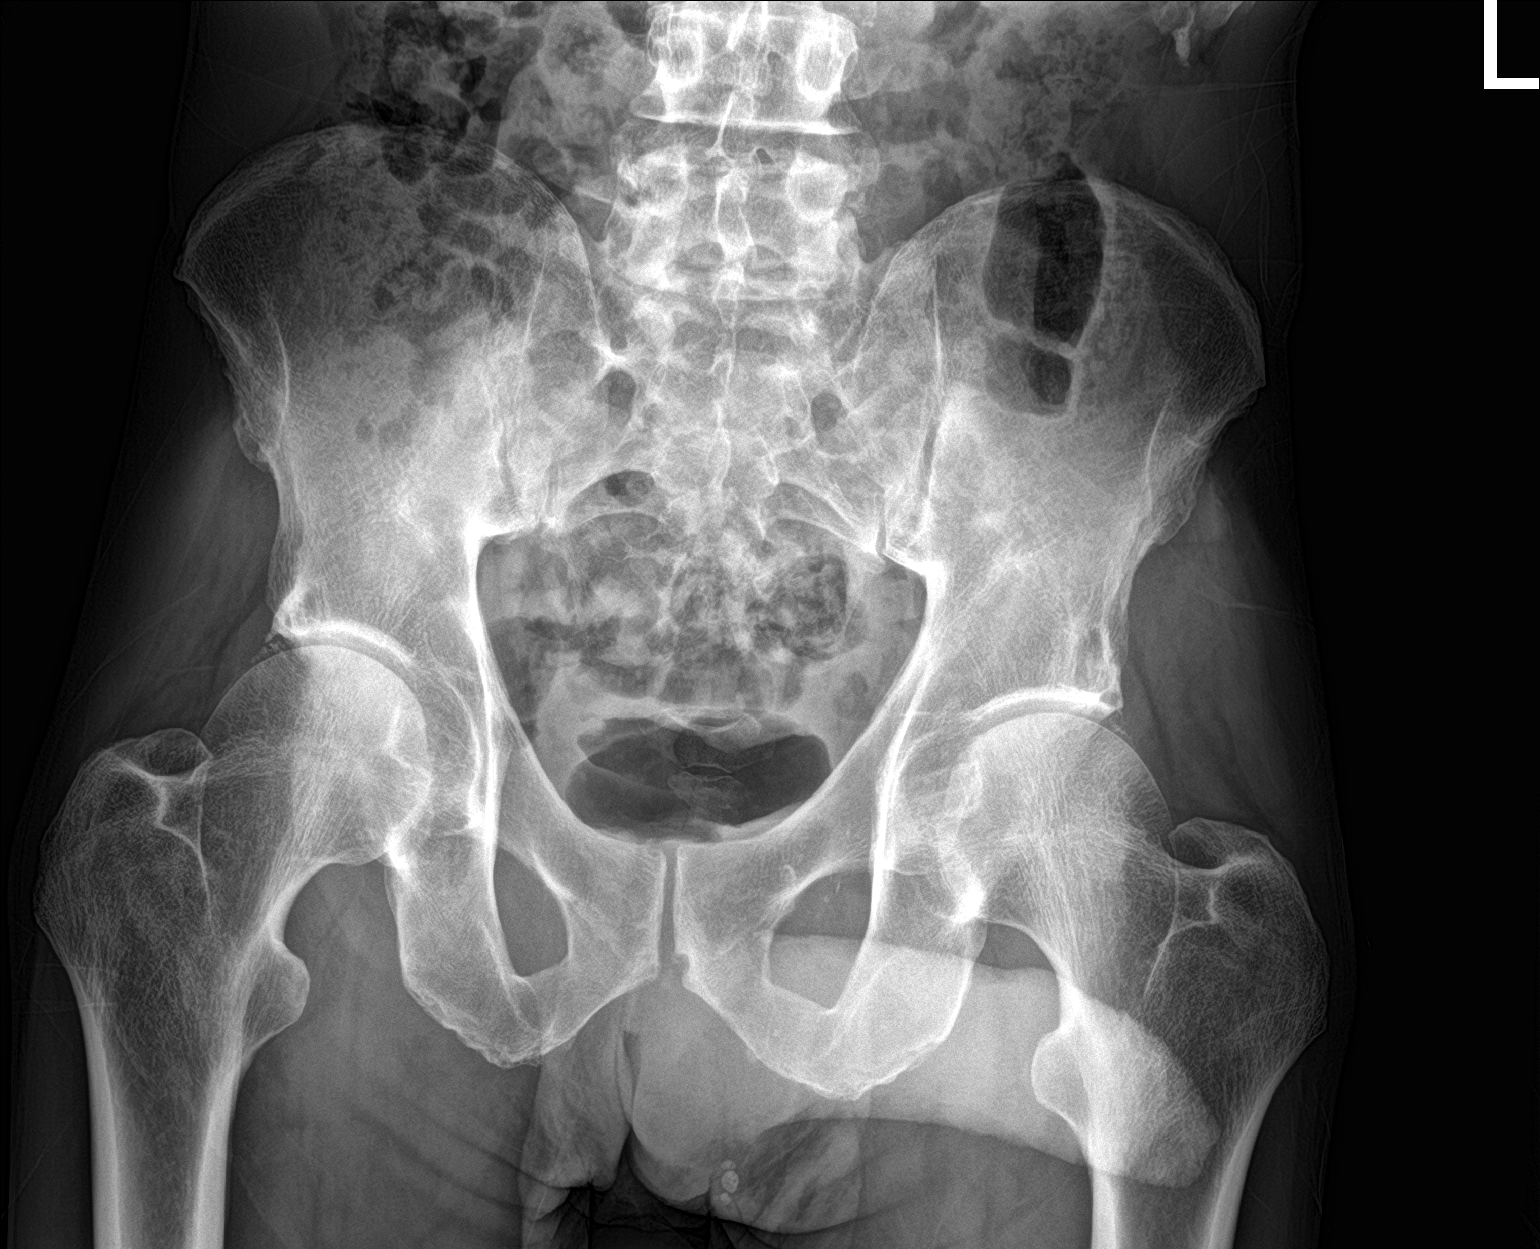

[hip ap]
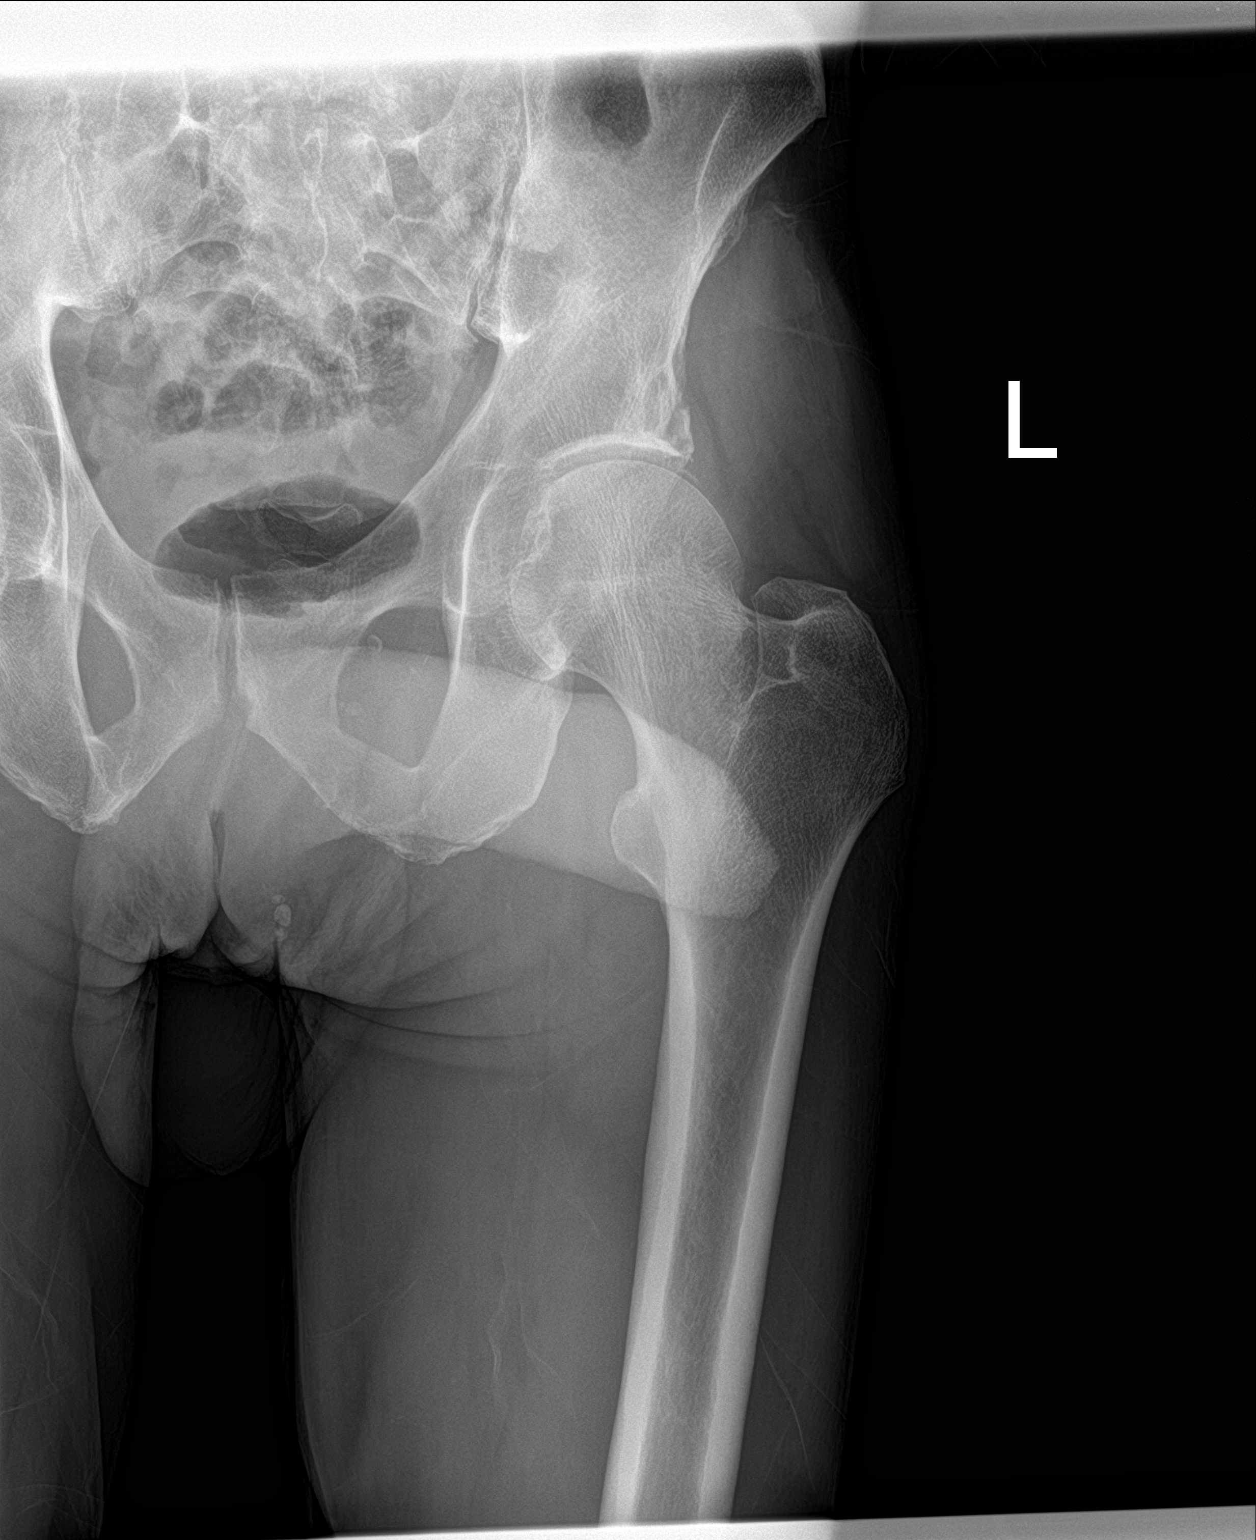

[hip lat]
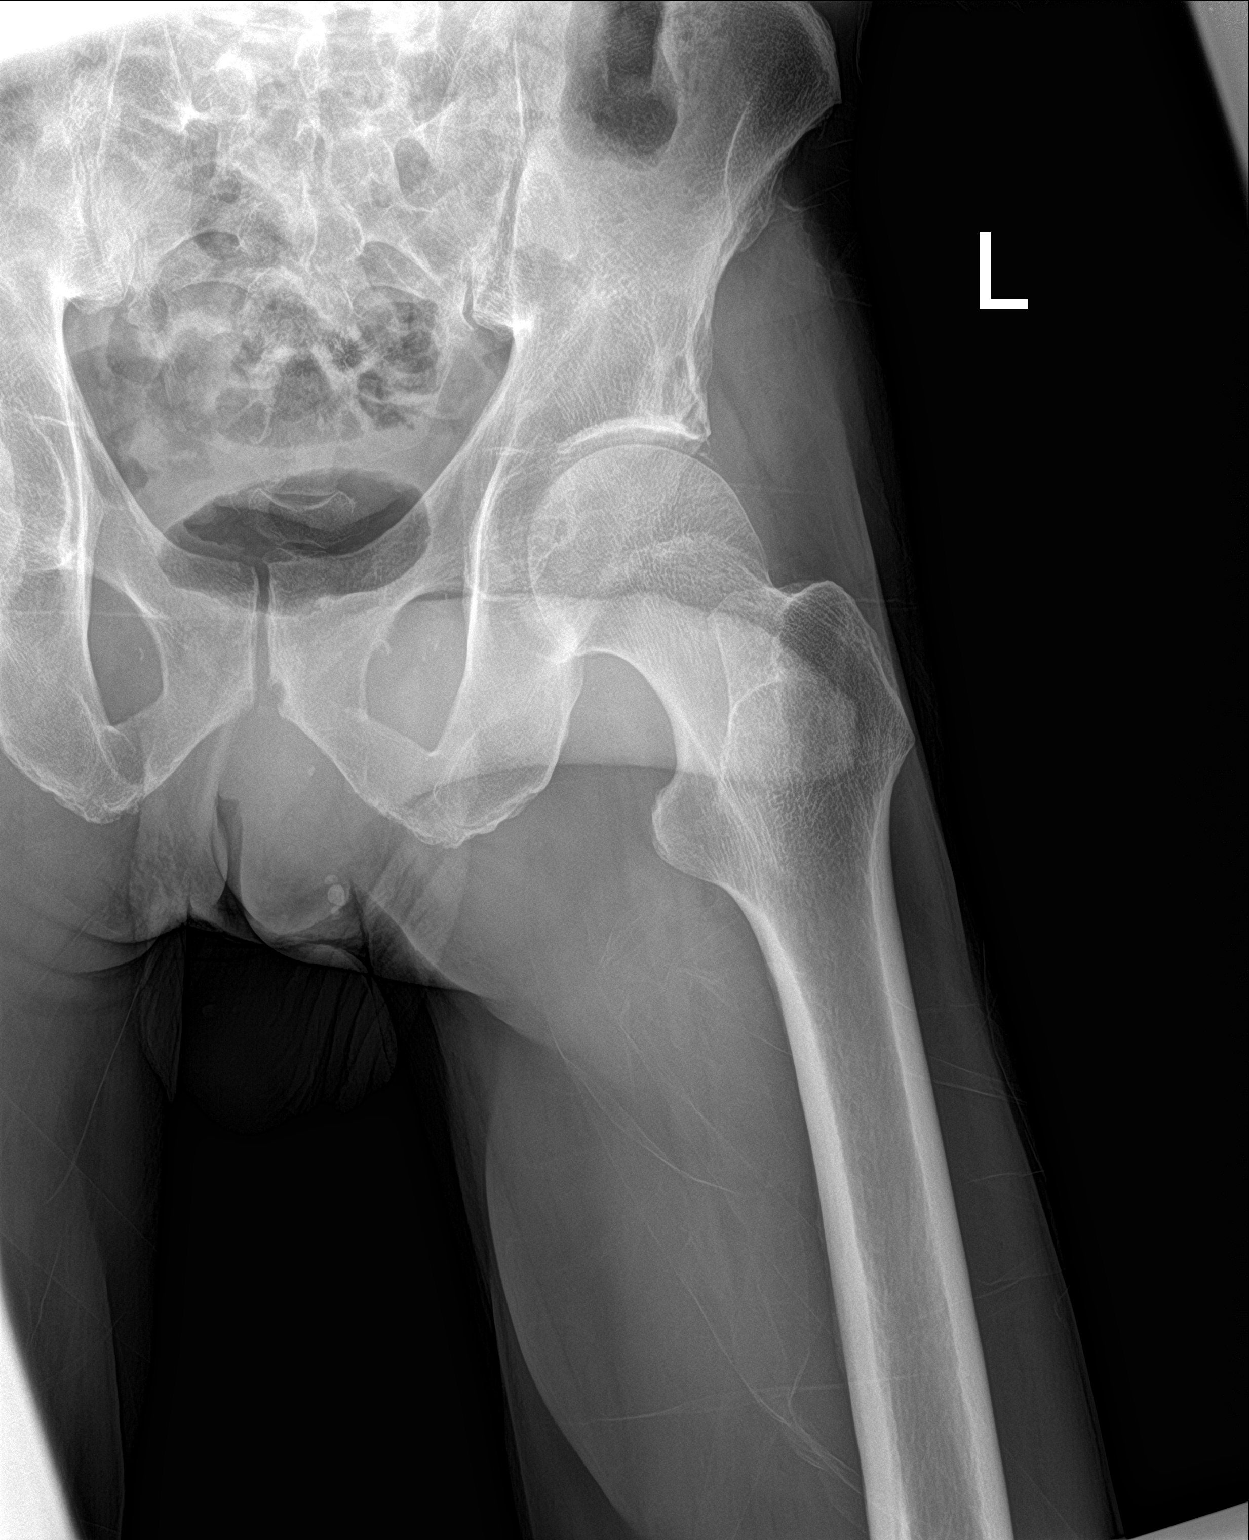

[3 of 3 positions shown; findings below may reference images not displayed]

FINDINGS: There is no evidence of hip fracture or dislocation. There is no
evidence of arthropathy or other focal bone abnormality.
IMPRESSION: Negative.

## 2023-04-27 ENCOUNTER — Emergency Department (HOSPITAL_COMMUNITY): Payer: Medicaid Other

## 2023-04-27 ENCOUNTER — Emergency Department (HOSPITAL_COMMUNITY)
Admission: EM | Admit: 2023-04-27 | Discharge: 2023-04-27 | Disposition: A | Discharge status: Home / Self Care | Payer: Medicaid Other | Attending: Emergency Medicine | Admitting: Emergency Medicine

## 2023-04-27 ENCOUNTER — Encounter (HOSPITAL_COMMUNITY): Payer: Self-pay

## 2023-04-27 ENCOUNTER — Other Ambulatory Visit: Payer: Self-pay

## 2023-04-27 DIAGNOSIS — L03011 Cellulitis of right finger: Secondary | ICD-10-CM | POA: Diagnosis not present

## 2023-04-27 DIAGNOSIS — M1811 Unilateral primary osteoarthritis of first carpometacarpal joint, right hand: Secondary | ICD-10-CM | POA: Diagnosis not present

## 2023-04-27 DIAGNOSIS — M79641 Pain in right hand: Secondary | ICD-10-CM | POA: Diagnosis not present

## 2023-04-27 DIAGNOSIS — R2231 Localized swelling, mass and lump, right upper limb: Secondary | ICD-10-CM | POA: Diagnosis present

## 2023-04-27 DIAGNOSIS — M7989 Other specified soft tissue disorders: Secondary | ICD-10-CM | POA: Diagnosis not present

## 2023-04-27 MED ORDER — ACETAMINOPHEN 325 MG PO TABS
650.0000 mg | ORAL_TABLET | Freq: Once | ORAL | Status: AC
  Administered 2023-04-27: 650 mg via ORAL
  Filled 2023-04-27: qty 2

## 2023-04-27 MED ORDER — LIDOCAINE HCL (PF) 1 % IJ SOLN
5.0000 mL | Freq: Once | INTRAMUSCULAR | Status: AC
  Administered 2023-04-27: 5 mL
  Filled 2023-04-27: qty 5

## 2023-04-27 MED ORDER — IBUPROFEN 400 MG PO TABS
400.0000 mg | ORAL_TABLET | Freq: Once | ORAL | Status: AC
  Administered 2023-04-27: 400 mg via ORAL
  Filled 2023-04-27: qty 1

## 2023-04-27 NOTE — ED Triage Notes (Signed)
Patient complains of right thumb swelling and pain x 2 weeks, no known injury. Painful to touch with pain/knot also to right axilla

## 2023-04-27 NOTE — ED Provider Notes (Signed)
[place] EMERGENCY DEPARTMENT AT [facility] Provider Note   CSN: [id number] Arrival date & time: 04/27/23  1116     History  No chief complaint on file.   Lucas Walker is a 62 y.o. male.  Patient is a 62 year old right-hand-dominant male with no significant past medical history presenting to the emergency department with thumb swelling.  States that he has had increased pain and swelling around his thumb for the last 2 weeks.  He states that the last 2 days his first and second MCP joints of also felt swollen.  He denies any drainage or fevers.  He denies any trauma to his hand.  He states that the tip of his thumb is starting to feel numb.  The history is provided by the patient.       Home Medications Prior to Admission medications   Medication Sig Start Date End Date Taking? Authorizing Provider  acetaminophen (TYLENOL) 500 MG tablet Take 1 tablet (500 mg total) by mouth every 6 (six) hours as needed. 10/08/20   Hyman Hopes, Margaux, PA-C  diclofenac Sodium (VOLTAREN) 1 % GEL Apply 2 g topically 4 (four) times daily. 03/23/21   Jeannie Fend, PA-C  ibuprofen (ADVIL) 600 MG tablet Take 1 tablet (600 mg total) by mouth every 6 (six) hours as needed. 04/06/22   Dartha Lodge, PA-C  lidocaine (LIDODERM) 5 % Place 1 patch onto the skin daily. Remove & Discard patch within 12 hours or as directed by MD 03/23/21   Jeannie Fend, PA-C  meloxicam (MOBIC) 7.5 MG tablet Take 1 tablet (7.5 mg total) by mouth daily. 08/06/21   Ivonne Andrew, NP  mupirocin cream (BACTROBAN) 2 % Apply 1 application topically 2 (two) times daily. 08/06/21   Ivonne Andrew, NP      Allergies    Patient has no known allergies.    Review of Systems   Review of Systems  Physical Exam Updated Vital Signs BP (!) 144/89 (BP Location: Right Arm)   Pulse 91   Temp 98.5 F (36.9 C) (Oral)   Resp 20   SpO2 98%  Physical Exam Vitals and nursing note reviewed.  Constitutional:      General: He  is not in acute distress.    Appearance: Normal appearance.  HENT:     Head: Normocephalic and atraumatic.     Nose: Nose normal.     Mouth/Throat:     Mouth: Mucous membranes are moist.  Eyes:     Extraocular Movements: Extraocular movements intact.  Cardiovascular:     Rate and Rhythm: Normal rate.     Pulses: Normal pulses.  Pulmonary:     Effort: Pulmonary effort is normal.  Abdominal:     General: Abdomen is flat.  Musculoskeletal:     Cervical back: Normal range of motion.     Comments: Tenderness and swelling to palpation of the distal R 1st phalanx, mild tenderness and swelling to 2nd MCP joint 1st and 2nd digit flexion/extension intact at DIP/PIP/MCP  Skin:    General: Skin is warm and dry.     Comments: Swelling with palpable fluctuance along nailbed on R 1st digit  Neurological:     General: No focal deficit present.     Mental Status: He is alert and oriented to person, place, and time.  Psychiatric:        Mood and Affect: Mood normal.        Behavior: Behavior normal.  ED Results / Procedures / Treatments   Labs (all labs ordered are listed, but only abnormal results are displayed) Labs Reviewed - No data to display  EKG None  Radiology No results found.  Procedures .Marland KitchenIncision and Drainage  Date/Time: 04/27/2023 2:49 PM  Performed by: Rexford Maus, DO Authorized by: Rexford Maus, DO   Consent:    Consent obtained:  Verbal   Consent given by:  Patient   Risks discussed:  Bleeding, incomplete drainage, pain and infection   Alternatives discussed:  No treatment and delayed treatment Universal protocol:    Procedure explained and questions answered to patient or proxy's satisfaction: yes     Patient identity confirmed:  Verbally with patient Location:    Type:  Abscess   Size:  1x0.5 cm   Location:  Upper extremity   Upper extremity location:  Finger   Finger location:  R thumb Pre-procedure details:    Skin preparation:   Chlorhexidine with alcohol Sedation:    Sedation type:  None Anesthesia:    Anesthesia method:  Nerve block   Block location:  Digital, R thumb   Block needle gauge:  27 G   Block anesthetic:  Lidocaine 1% w/o epi Procedure type:    Complexity:  Simple Procedure details:    Ultrasound guidance: no     Needle aspiration: no     Incision types:  Single straight   Incision depth:  Dermal   Drainage:  Purulent   Drainage amount:  Moderate   Wound treatment:  Wound left open   Packing materials:  None Post-procedure details:    Procedure completion:  Tolerated well, no immediate complications     Medications Ordered in ED Medications  lidocaine (PF) (XYLOCAINE) 1 % injection 5 mL (5 mLs Infiltration Given 04/27/23 1310)  acetaminophen (TYLENOL) tablet 650 mg (650 mg Oral Given 04/27/23 1310)  ibuprofen (ADVIL) tablet 400 mg (400 mg Oral Given 04/27/23 1310)    ED Course/ Medical Decision Making/ A&P Clinical Course as of 04/27/23 1530  Mon Apr 27, 2023  1503 Paronychia drained, follow up X-ray, likely DC [JD]  1529 Xray without acute abnormality. He is stable for discharge with outpatient follow up. [VK]    Clinical Course User Index [JD] Laurence Spates, MD [VK] Rexford Maus, DO                                 Medical Decision Making This patient presents to the ED with chief complaint(s) of thumb swelling with no pertinent past medical history which further complicates the presenting complaint. The complaint involves an extensive differential diagnosis and also carries with it a high risk of complications and morbidity.    The differential diagnosis includes paronychia, no evidence of felon, ROM intact without fusiform swelling making flexor tenosynovitis unlikely, no significant trauma making fracture dislocation less likely, considering arthritis  Additional history obtained: Additional history obtained from N/A Records reviewed Primary Care Documents  ED  Course and Reassessment: On patient's arrival he is hemodynamically stable in no acute distress.  He does have swelling with fluctuance around his right thumb nailbed consistent with a paronychia that will require I&D.  He does have some additional joint swelling and will have x-ray performed to evaluate for arthritis or other bony abnormality to cause his swelling.  His hand will be placed in a bucket of warm water prior to I&D.  He was  given Tylenol and Motrin for pain control.  Independent labs interpretation:  N/A  Independent visualization of imaging: - I independently visualized the following imaging with scope of interpretation limited to determining acute life threatening conditions related to emergency care: R hand XR, which revealed no acute disease  Consultation: - Consulted or discussed management/test interpretation w/ external professional: N/A  Consideration for admission or further workup: Patient has no emergent conditions requiring admission or further work-up at this time and is stable for discharge home with primary care follow-up  Social Determinants of health: N/A    Amount and/or Complexity of Data Reviewed Radiology: ordered.  Risk OTC drugs. Prescription drug management.          Final Clinical Impression(s) / ED Diagnoses Final diagnoses:  Paronychia of right thumb    Rx / DC Orders ED Discharge Orders     None         Rexford Maus, DO 04/27/23 1519

## 2023-04-27 NOTE — Discharge Instructions (Signed)
You were seen in the emergency department for your finger swelling.  You had what is called a paronychia which is an abscess along your fingernail and we are able to drain it for you in the emergency department.  You can continue to soak your hand in warm water for about 20 minutes at a time a few times a day to continue to encourage drainage and you can take Tylenol or Motrin as needed for pain.  You can follow-up with your primary doctor in 2 to 3 days to have your wound rechecked.  You should return to the emergency department if you develop spreading redness up your hand, you are unable to bend or move your thumb, you have fevers or if you have any other new or concerning symptoms.

## 2023-04-27 NOTE — ED Notes (Signed)
Patient transported to X-ray
